# Patient Record
Sex: Male | Born: 1940 | Race: White | Hispanic: No | Smoking: Former smoker
Health system: Southern US, Community
[De-identification: ages and names within clinical notes are randomized; demographics above are authoritative.]

## PROBLEM LIST (undated history)

## (undated) DIAGNOSIS — I10 Essential (primary) hypertension: Secondary | ICD-10-CM

## (undated) DIAGNOSIS — N200 Calculus of kidney: Secondary | ICD-10-CM

## (undated) DIAGNOSIS — E7800 Pure hypercholesterolemia, unspecified: Secondary | ICD-10-CM

## (undated) DIAGNOSIS — E119 Type 2 diabetes mellitus without complications: Secondary | ICD-10-CM

## (undated) HISTORY — DX: Essential (primary) hypertension: I10

## (undated) HISTORY — DX: Type 2 diabetes mellitus without complications: E11.9

## (undated) HISTORY — DX: Calculus of kidney: N20.0

## (undated) HISTORY — PX: EYE SURGERY: SHX253

## (undated) HISTORY — DX: Pure hypercholesterolemia, unspecified: E78.00

## (undated) HISTORY — PX: TONSILLECTOMY: SUR1361

---

## 1999-01-09 ENCOUNTER — Ambulatory Visit (HOSPITAL_COMMUNITY): Admission: RE | Admit: 1999-01-09 | Discharge: 1999-01-10 | Payer: Self-pay | Admitting: Ophthalmology

## 1999-01-09 ENCOUNTER — Encounter: Payer: Self-pay | Admitting: Ophthalmology

## 2000-09-02 ENCOUNTER — Ambulatory Visit (HOSPITAL_COMMUNITY): Admission: RE | Admit: 2000-09-02 | Discharge: 2000-09-02 | Payer: Self-pay | Admitting: Internal Medicine

## 2000-09-02 ENCOUNTER — Encounter: Payer: Self-pay | Admitting: Internal Medicine

## 2001-03-31 ENCOUNTER — Ambulatory Visit (HOSPITAL_COMMUNITY): Admission: RE | Admit: 2001-03-31 | Discharge: 2001-04-01 | Payer: Self-pay | Admitting: Cardiovascular Disease

## 2001-03-31 ENCOUNTER — Encounter: Payer: Self-pay | Admitting: Cardiovascular Disease

## 2002-07-06 ENCOUNTER — Encounter: Admission: RE | Admit: 2002-07-06 | Discharge: 2002-10-04 | Payer: Self-pay | Admitting: Internal Medicine

## 2002-09-19 ENCOUNTER — Ambulatory Visit (HOSPITAL_COMMUNITY): Admission: RE | Admit: 2002-09-19 | Discharge: 2002-09-19 | Payer: Self-pay | Admitting: Internal Medicine

## 2003-05-04 ENCOUNTER — Emergency Department (HOSPITAL_COMMUNITY): Admission: EM | Admit: 2003-05-04 | Discharge: 2003-05-04 | Payer: Self-pay | Admitting: Emergency Medicine

## 2003-07-09 ENCOUNTER — Ambulatory Visit (HOSPITAL_COMMUNITY): Admission: RE | Admit: 2003-07-09 | Discharge: 2003-07-09 | Payer: Self-pay | Admitting: Internal Medicine

## 2004-09-25 ENCOUNTER — Ambulatory Visit (HOSPITAL_COMMUNITY): Admission: RE | Admit: 2004-09-25 | Discharge: 2004-09-25 | Payer: Self-pay | Admitting: Internal Medicine

## 2004-09-30 ENCOUNTER — Ambulatory Visit (HOSPITAL_COMMUNITY): Admission: RE | Admit: 2004-09-30 | Discharge: 2004-09-30 | Payer: Self-pay | Admitting: Internal Medicine

## 2005-07-07 ENCOUNTER — Ambulatory Visit: Payer: Self-pay | Admitting: *Deleted

## 2005-07-30 ENCOUNTER — Ambulatory Visit (HOSPITAL_COMMUNITY): Admission: RE | Admit: 2005-07-30 | Discharge: 2005-07-30 | Payer: Self-pay | Admitting: Cardiovascular Disease

## 2005-08-06 ENCOUNTER — Ambulatory Visit: Payer: Self-pay | Admitting: *Deleted

## 2006-11-26 ENCOUNTER — Ambulatory Visit (HOSPITAL_COMMUNITY): Admission: RE | Admit: 2006-11-26 | Discharge: 2006-11-26 | Payer: Self-pay | Admitting: Internal Medicine

## 2007-07-24 ENCOUNTER — Emergency Department (HOSPITAL_COMMUNITY): Admission: EM | Admit: 2007-07-24 | Discharge: 2007-07-24 | Payer: Self-pay | Admitting: Emergency Medicine

## 2007-08-04 ENCOUNTER — Ambulatory Visit (HOSPITAL_COMMUNITY): Admission: RE | Admit: 2007-08-04 | Discharge: 2007-08-04 | Payer: Self-pay | Admitting: Internal Medicine

## 2008-07-10 ENCOUNTER — Ambulatory Visit: Payer: Self-pay | Admitting: Internal Medicine

## 2008-07-30 ENCOUNTER — Ambulatory Visit: Payer: Self-pay | Admitting: Internal Medicine

## 2008-07-30 ENCOUNTER — Ambulatory Visit (HOSPITAL_COMMUNITY): Admission: RE | Admit: 2008-07-30 | Discharge: 2008-07-30 | Payer: Self-pay | Admitting: Internal Medicine

## 2008-07-30 ENCOUNTER — Encounter: Payer: Self-pay | Admitting: Internal Medicine

## 2008-08-01 ENCOUNTER — Encounter: Payer: Self-pay | Admitting: Internal Medicine

## 2008-11-02 ENCOUNTER — Encounter (INDEPENDENT_AMBULATORY_CARE_PROVIDER_SITE_OTHER): Payer: Self-pay | Admitting: *Deleted

## 2009-04-24 ENCOUNTER — Encounter: Payer: Self-pay | Admitting: Internal Medicine

## 2010-09-30 NOTE — Op Note (Signed)
NAME:  Calvin Mills, Calvin Mills                  ACCOUNT NO.:  [id number]   MEDICAL RECORD NO.:  [id number]          PATIENT TYPE:  AMB   LOCATION:  DAY                           FACILITY:  [facility]   PHYSICIAN:  R. Roetta Sessions, M.D. DATE OF BIRTH:  [birth year]   DATE OF PROCEDURE:  07/30/2008  DATE OF DISCHARGE:                               OPERATIVE REPORT   PROCEDURE PERFORMED:  Colonoscopy with multiple snare polypectomies.   INDICATIONS FOR PROCEDURE:  A 70 year old gentleman history of multiple  colonic adenomas, high-grade dysplasia back in 2005.  He has no lower GI  tract symptoms.  Colonoscopy is now being done as surveillance maneuver.  Risks, benefits, alternatives, and limitations have been reviewed.  Please see the dictation in the medical record.   PROCEDURE NOTE:  O2 saturation, blood pressure, pulse and respirations  were monitored throughout the entire procedure.  Conscious sedation with  Versed 3 mg IV, Demerol 50 mg IV in divided doses.   INSTRUMENT:  Pentax video chip system.   FINDINGS:  Digital rectal exam revealed no abnormalities.  The prep was  good.  Colon:  Colonic mucosa was surveyed from rectosigmoid junction through  the left, transverse and right colon to the appendiceal orifice,  ileocecal valve and cecum.  These structures were well seen and  photographed for record.  From this level, the scope was slowly and  cautiously withdrawn.  All previously mentioned mucosal surfaces were  again seen. Going in at the hepatic flexure there were couple of 6 mm  polyps which were hot snared and recovered.  There were two more 6-mm  polyps in the base the cecum which were cold snared and on the distal  side of the ileocecal valve there was a sprawling carpet polyp with  dimensions approximately 2-3 cm dipped in between the ileocecal valve  and the first fold distal to it.  It was difficult to fully appreciate  this area.  It appeared to be a sprawling carpet adenoma and just  opposite to this area there was a 1-cm pedunculated polyp.  This carpet  polyp was approached with normal saline.  A good 2-3 mL of normal saline  was injected at the base of this polyp and nicely lifted away from the  colonic wall.  Subsequently, multiple hot snare passes were made and  this thing was debulked significantly.  I would conservatively estimate  at least two-thirds this polyp was removed.  There was really minimal  bleeding with this maneuver.  The polyp on the opposite wall was  resected in two passes with the snare cautery and it was submitted with  the carpet polyp specimen.  These maneuvers took an extended period of  time.  The patient was noted to have also pan colonic diverticula and  may have had a small diminutive polyp in the mid descending colon which  was left alone.  Scope was pulled down to the rectum where thorough  examination of the rectal mucosa including retroflexion at the anal  verge demonstrated no abnormalities.  The patient tolerated the  procedure well,  was reacted in endoscopy.   IMPRESSION:  1. Normal rectum.  2. Pan colonic diverticula.  Multiple colonic polyps as described      above, the largest being 2 to 3 cm carpet polyp on the distal side      of the ileocecal valve and partially removed with saline assisted      piecemeal hot snare polypectomy.   RECOMMENDATIONS:  1. Absolutely no aspirin or nonsteroidal agents for the next 7 days.  2. Follow-up on path.  3. If there is high-grade pathology in the ascending colon specimen, I      would recommend he have a right hemicolectomy. If not, I would with      things heal in and plan to bring this nice gentleman back in 3      months to remove whenever adenomatous tissue remains in the      ascending colon.  Further recommendations to follow.  Cecal      withdrawal time 48 minutes.      Jonathon Bellows, M.D.  Electronically Signed     RMR/MEDQ  D:  07/30/2008  T:  07/30/2008  Job:   [id number]

## 2010-09-30 NOTE — H&P (Signed)
NAME:  Calvin Mills, Calvin Mills                  ACCOUNT NO.:  [id number]   MEDICAL RECORD NO.:  [id number]           PATIENT TYPE:  AMB   LOCATION:  DAY                           FACILITY:  [facility]   PHYSICIAN:  R. Roetta Sessions, M.D. DATE OF BIRTH:  November 26, 1940   DATE OF ADMISSION:  DATE OF DISCHARGE:  LH                              HISTORY & PHYSICAL   CHIEF COMPLAINT:  Needs colonoscopy, history of polyps.   PHYSICIAN CO-SIGNING:  Jonathon Bellows, MD   HISTORY OF PRESENT ILLNESS:  Calvin Mills is a very pleasant 70 year old  gentleman who was due for a 5-year followup surveillance colonoscopy.  He has a history of complicated adenomatous polyps in the past.  He had  a colonoscopy in 2004 at which time he had a large sessile polyp from  the proximal rectum that was greater than 15 mm.  He has small polyp at  the ascending colon in the splenic flexure as well.  Pathology from the  rectal polyp came back adenomatous and had intramucosal high grade  dysplasia, but no invasive tumor.  The patient came back for followup  flexible sigmoidoscopy in February 2005 and had 2 small flap polyps  noted to be residual at the polypectomy site in the rectosigmoid area  which were coagulated.  He also had a tiny polyp in the sigmoid region  which was benign.  He has been doing well.  Denies any problems with his  bowel movements.  No blood in the stool or melena.  No abdominal pain,  heartburn, dysphagia, odynophagia, weight loss, nausea, or vomiting.   CURRENT MEDICATIONS:  1. Glipizide 5 mg daily.  2. Toprol 50 mg daily.  3. Multivitamin daily.  4. Aspirin 81 mg daily.   ALLERGIES:  PENICILLIN causes rash.   PAST MEDICAL HISTORY:  1. History of colon polyps, as above.  2. Diabetes mellitus.  3. CAD, status post cardiac stent 8 years ago.   FAMILY HISTORY:  Brother was diagnosed with colon cancer, age 71.   SOCIAL HISTORY:  Married with 2 children.  He is retired.  He quit  smoking 15 years ago.  No  alcohol use.   REVIEW OF SYSTEMS:  GI:  See HPI.  CONSTITUTIONAL.  No weight loss.  CARDIOPULMONARY:  No chest pain, shortness of breath, palpitations, or  cough.  GENITOURINARY:  No dysuria, hematuria.   PHYSICAL EXAMINATION:  VITAL SIGNS:  Weight 195, height 6 feet,  temperature 97.4, blood pressure 152/98, pulse 76.  GENERAL:  Pleasant, well-nourished, well-developed, Caucasian gentleman  in no acute distress.  SKIN:  Warm, dry.  No jaundice.  HEENT: Sclerae nonicteric.  Oropharyngeal mucosa moist and pink.  No  lesions, erythema, or exudate.  No lymphadenopathy, thyromegaly.  CHEST:  Lungs are clear to auscultation.  CARDIAC:  Regular rate and rhythm.  No murmurs, rubs, or gallops.  ABDOMEN:  Positive bowel sounds.  Abdomen is soft, nontender,  nondistended.  No organomegaly or masses.  No rebound or guarding.  No  abdominal bruits or hernias.  LOWER EXTREMITIES:  No edema.  IMPRESSION:  Calvin Mills is a 70 year old gentleman with history of  complicated adenomatous polyp with high grade dysplasia back in 2005.  He is due for surveillance colonoscopy at this time.  I discussed risks,  alternatives, benefits with regards to, but not limited to the risk,  reaction of medication, bleeding, infection, perforation, and he is  agreeable to proceed.   PLAN:  1. Colonoscopy with Dr. Jena Gauss in the near future.  The day of the      prep, he will have his glipizide 2.5 mg.  He will hold his aspirin      4 days prior to procedure.  2. The patient is asked that he have minimal sedation, so that he      could be more awake during the colonoscopy.  Previously, he has      tolerated this.  Tana Coast, P.A.      Jonathon Bellows, M.D.  Electronically Signed    LL/MEDQ  D:  07/10/2008  T:  07/11/2008  Job:  [id number]   cc:   Madelin Rear. Sherwood Gambler, MD  Fax: [phone]

## 2010-10-03 NOTE — Cardiovascular Report (Signed)
[place]  Patient:    Calvin Mills, Calvin Mills Visit Number: [id number] MRN: [id number]          Service Type: CAT Location: 3700 [id number] [id number] Attending Physician:  Virgina Evener Dictated by:   Madaline Savage, M.D. Proc. Date: 03/31/01 Admit Date:  03/31/2001   CC:         Artis Delay, M.D., [facility], [place], [place]  Cardiac Catheterization Laboratory   Cardiac Catheterization  PROCEDURES PERFORMED: 1. Selective coronary angiography by Judkins technique. 2. Retrograde left heart catheterization. 3. Left ventricular angiography. 4. Intracoronary artery stent deployment into the mid right coronary artery.  COMPLICATIONS: None.  ENTRY SITE: Right femoral.  DYE USED: Omnipaque.  PATIENT PROFILE: The patient is a 70 year old previous smoker who has treated hypertension. He recently had some indigestion type symptoms and was referred for treadmill testing and was found to have inferior and anterolateral ST segment depressions with exercise.  Today, the patient presents to the catheterization lab on an outpatient basis electively to correlate symptoms and abnormal ECG with coronary anatomy.  RESULTS:  PRESSURES: The left ventricular pressure was 125/15, central aortic pressure was 120/73.  No significant aortic valve gradient was noted by pullback technique.  ANGIOGRAPHIC RESULTS: The left main coronary artery was normal.  The left anterior descending coronary artery coursed to the cardiac apex and gave rise to tiny diagonal branches, approximately three of them and one major septal perforator branch. The LAD wrapped around the apex.  The left circumflex coronary artery was nondominant and no lesions were seen.  The right coronary artery was large and dominant giving rise to a posterolateral and a posterior descending branch of moderate size. There was a lesion in the mid right coronary artery between acute marginal  branch #1 and acute marginal branch #2 that was a type A lesion, 75% severe in the 33 degree RAO view with 15 degrees of caudal angulation.  The left ventricle showed good contractility with normal wall motion and an ejection fraction of 77% calculated. No mitral regurgitation was seen.  INTERVENTIONAL PROCEDURE: This procedure was performed with 7 French introducer sheaths and 7 French catheters. The guide catheter was a 7 [place] 4 right guide catheter with side holes. The guide wire was a short Patriot wire and direct coronary artery stenting was accomplished using a 3.0 x 12 mm Express II stent by [employer]. The guide catheter provided excellent backup support and good visualization of the vessel. The guide wire easily coursed though the lesion and was brought to rest in the distal posterolateral branch. The stent easily crossed the lesion and was deployed to a total of 16 atmospheres for 60 seconds, and we took views in both LAO and RAO projections and saw no residual stenosis. The initial TIMI-3 class flow was preserved at the end of stenting. There was mild ST segment elevation in the inferior leads during balloon inflation of the stent and there was clinically the same discomfort during balloon inflation that the patient has experienced in the recent past.  No complications occurred. Direct stenting was successful by reducing the 75% lesion to 0% residual.  FINAL DIAGNOSES: 1. Successful intracoronary artery stenting of the mid right coronary artery    with a right coronary artery lesion in the mid segment reduced from    75% to 0%. 2. Normal left anterior descending, circumflex and left main coronary    arteries. 3. Normal left ventricular function with ejection fraction of 77%.  PLAN: Risk factor modification, optimization of blood pressure medicines and clinical followup. Dictated by:   Madaline Savage, M.D. Attending Physician:  Virgina Evener DD:   03/31/01 TD:  03/31/01 Job: [id number]

## 2010-10-03 NOTE — Discharge Summary (Signed)
[place]. [facility]  Patient:    Calvin Mills, Calvin Mills Visit Number: [id number] MRN: [id number]          Service Type: CAT Location: 3700 3713 01 Attending Physician:  Virgina Evener Dictated by:   Raymon Mutton, P.A. Admit Date:  03/31/2001 Discharge Date: 04/01/2001                             Discharge Summary  DATE OF BIRTH:  04/08/1941.  DISCHARGE DIAGNOSES: 1. Coronary artery disease status post cardiac catheterization with coronary    intervention. 2. Hypertension. 3. Unknown lipid status, will follow up on that as an outpatient.  HOSPITAL PROCEDURE:  Cardiac catheterization performed on April 01, 2001, by Madaline Savage, M.D.  Cath showed 75% mid RCA stenosis and he underwent PTCA and stent to mid RCA with a reduction of stenosis from 75% to 0, ejection fraction 60%.  HOSPITAL COMPLICATIONS:  None.  HISTORY OF PRESENT ILLNESS:  A 70 year old gentleman who has been seen in [place], [place], last time with complaints of indigestion and was scheduled for stress test but the initial stress test was cancelled secondary to increased blood pressure and he was scheduled for March 10, 2001 and during the treadmill, his blood pressure increased and stress test revealed abnormal myocardial perfusion and he was scheduled for cardiac catheterization with possible intervention as elective outpatient.  HOSPITAL COURSE:  He underwent the procedure as outlined above and tolerated it well.  He was given bolus drip of Integrelin, initial TIMI-3 plus flow was preserved at the end of stenting and the patient was stable throughout his admission.  His physical examination was benign.  Blood pressure 122/72, pulse 65. He did not develop any complications such as bleeding or ecchymosis from the groin site.  He was discharged home the next day with instructions to avoid heavy lifting and driving and strenuous activity for three days. Continue  low fat, low cholesterol diet and report any problems to our office that he could experience after a cath.  DISCHARGE MEDICATIONS: 1. Enteric coated aspirin 325 mg daily. 2. Plavix 75 mg daily. 3. Protonix 40 mg daily. 4. Toprol XL 50 mg daily.  FOLLOW-UP:  Appointment was scheduled with Dr. Elsie Lincoln on April 18, 2001, at 4 p.m. in [place], [place], office. Dictated by:   Raymon Mutton, P.A. Attending Physician:  Virgina Evener DD:  04/01/01 TD:  04/01/01 Job: [id number]

## 2010-10-03 NOTE — Op Note (Signed)
NAME:  [name], Calvin Mills                            ACCOUNT NO.:  [id number]   MEDICAL RECORD NO.:  [id number]                   PATIENT TYPE:  AMB   LOCATION:  DAY                                  FACILITY:  [facility]   PHYSICIAN:  Lionel December, M.D.                 DATE OF BIRTH:  1940/12/29   DATE OF PROCEDURE:  07/09/2003  DATE OF DISCHARGE:                                 OPERATIVE REPORT   PROCEDURE:  Flexible sigmoidoscopy.   ENDOSCOPIST:  Lionel December, M.D.   INDICATIONS:  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  He was advised to undergo a sigmoidoscopy to make sure that there was no  residual polyp at polypectomy site.  He is asymptomatic.  The procedure and  risks were reviewed with the patient and informed consent was obtained.   PREOPERATIVE MEDICATIONS:  None.   FINDINGS:  Procedure performed in endoscopy suite. The patient was placed in  the left lateral recumbent position and rectal examination performed.  No  abnormality noted on external or digital exam.   Olympus videoscope was placed in the rectum and advanced under vision into  the sigmoid colon.  There was 1 small diverticulum at the sigmoid colon.  There was a 3 mm polyp at about 30 cm which was ablated by cold biopsy.  The  rectosigmoid area was carefully examined. There were 2 small flat polyps  which were noted to be residual at polypectomy site. One on the left side  was coagulated using snare tip.  The other one was raised with a submucosal  injection of saline, but never could be snared, but was completely  coagulated.  Pictures taken for the record.  There were 3 small rectal  polyps that were coagulated.   Endoscope was withdrawn.  The patient tolerated the procedure well.   FINAL DIAGNOSES:  1. Tiny residual polyp (2 pieces) at  rectosigmoid junction.  They were     coagulated using snare tip as described above.  2. Another tiny polyp ablated by a cold biopsy from the sigmoid colon; and     the 3 tiny polyps at rectum were coagulated.   RECOMMENDATIONS:  1. Standard instructions were given.  2. I will be contacting the patient with biopsy results.  3. High fiber diet.  4. He will return for surveillance colonoscopy in 5 years from now.      ___________________________________________                                            Lionel December, M.D.   NR/MEDQ  D:  07/09/2003  T:  07/09/2003  Job:  [id number]   cc:   Madelin Rear. Sherwood Gambler, M.D.  [address]  [place]  [place] [address]  Fax: [phone]

## 2010-10-03 NOTE — Op Note (Signed)
NAME:  Calvin Mills, Calvin Mills                            ACCOUNT NO.:  [id number]   MEDICAL RECORD NO.:  [id number]                   PATIENT TYPE:  AMB   LOCATION:  DAY                                  FACILITY:  [facility]   PHYSICIAN:  Lionel December, M.D.                 DATE OF BIRTH:  [birth year]   DATE OF PROCEDURE:  09/19/2002  DATE OF DISCHARGE:                                 OPERATIVE REPORT   PROCEDURE:  Total colonoscopy with polypectomy.   INDICATIONS FOR PROCEDURE:  The patient is a 70 year old Caucasian male who  is undergoing screening colonoscopy.  Family history is negative for  colorectal carcinoma.  The procedure was reviewed with the patient, and  informed consent was obtained.   PREOPERATIVE MEDICATIONS:  Versed 3 mg IV in divided doses.   INSTRUMENT USED:  Olympus video system.   FINDINGS:  The procedure was performed in the endoscopy suite.  The  patient's vital signs and O2 saturations were monitored during the procedure  and remained stable.  The patient was placed in the left lateral position  and rectal examination performed.  No abnormality was noted on external or  digital exam.  The scope was placed into the rectum where a large flat polyp  was noted.  It was villous-appearing.  It was felt on the way out.  The  scope was passed to the sigmoid colon and beyond.  The preparation was  satisfactory.  The scope was passed to the cecum which was identified by the  appendiceal orifice and ileocecal valve.  As the scope was withdrawn, the  colonic mucosa was carefully examined.  There was a small polyp on a fold at  the ascending colon which was coagulated using the snare tip.  There was  another small polyp in the area of the splenic flexure which was seen on the  way in but could not be found on the way out.  A few small diverticula were  noted in the sigmoid colon.  There was a flat villous-appearing polyp,  greater than 15 mm in diameter, at the proximal rectum.  This  was snared  piecemeal.  Polypectomy was felt to be complete.  The mucosa of the rest of  the rectum was normal.  The endoscope was withdrawn.  The patient tolerated  the procedure well.   FINAL DIAGNOSES:  1. Examination performed to the cecum.  2. Large sessile polyp snared from proximal rectum.  3. Another small polyp coagulated at ascending colon.  Another small polyp     was seen at the splenic flexure on the way in but could not be found on     the way out.  4. Mild sigmoid colon diverticulosis.   RECOMMENDATIONS:  1. Standard instructions given.  2.     High fiber diet.  3. I will contact the patient with the biopsy results.  Assuming that this     is an adenoma, will plan to bring him back for another colonoscopy in     three years from now.                                               Lionel December, M.D.    NR/MEDQ  D:  09/19/2002  T:  09/19/2002  Job:  [id number]   cc:   Madelin Rear. Sherwood Gambler, M.D.  [address]  [place]  [place] [address]  Fax: [phone]

## 2011-09-10 DIAGNOSIS — Z6825 Body mass index (BMI) 25.0-25.9, adult: Secondary | ICD-10-CM | POA: Diagnosis not present

## 2011-09-10 DIAGNOSIS — I10 Essential (primary) hypertension: Secondary | ICD-10-CM | POA: Diagnosis not present

## 2011-09-10 DIAGNOSIS — E785 Hyperlipidemia, unspecified: Secondary | ICD-10-CM | POA: Diagnosis not present

## 2011-09-10 DIAGNOSIS — Z125 Encounter for screening for malignant neoplasm of prostate: Secondary | ICD-10-CM | POA: Diagnosis not present

## 2011-09-10 DIAGNOSIS — R079 Chest pain, unspecified: Secondary | ICD-10-CM | POA: Diagnosis not present

## 2011-09-10 DIAGNOSIS — E119 Type 2 diabetes mellitus without complications: Secondary | ICD-10-CM | POA: Diagnosis not present

## 2011-09-10 DIAGNOSIS — I2510 Atherosclerotic heart disease of native coronary artery without angina pectoris: Secondary | ICD-10-CM | POA: Diagnosis not present

## 2011-09-10 DIAGNOSIS — N200 Calculus of kidney: Secondary | ICD-10-CM | POA: Diagnosis not present

## 2011-09-10 DIAGNOSIS — Z0000 Encounter for general adult medical examination without abnormal findings: Secondary | ICD-10-CM | POA: Diagnosis not present

## 2011-12-18 DIAGNOSIS — H524 Presbyopia: Secondary | ICD-10-CM | POA: Diagnosis not present

## 2011-12-18 DIAGNOSIS — H5231 Anisometropia: Secondary | ICD-10-CM | POA: Diagnosis not present

## 2011-12-18 DIAGNOSIS — H2589 Other age-related cataract: Secondary | ICD-10-CM | POA: Diagnosis not present

## 2011-12-18 DIAGNOSIS — E11329 Type 2 diabetes mellitus with mild nonproliferative diabetic retinopathy without macular edema: Secondary | ICD-10-CM | POA: Diagnosis not present

## 2011-12-22 DIAGNOSIS — E119 Type 2 diabetes mellitus without complications: Secondary | ICD-10-CM | POA: Diagnosis not present

## 2011-12-22 DIAGNOSIS — H2510 Age-related nuclear cataract, unspecified eye: Secondary | ICD-10-CM | POA: Diagnosis not present

## 2012-06-16 DIAGNOSIS — M549 Dorsalgia, unspecified: Secondary | ICD-10-CM | POA: Diagnosis not present

## 2012-06-16 DIAGNOSIS — IMO0001 Reserved for inherently not codable concepts without codable children: Secondary | ICD-10-CM | POA: Diagnosis not present

## 2012-06-16 DIAGNOSIS — Z6826 Body mass index (BMI) 26.0-26.9, adult: Secondary | ICD-10-CM | POA: Diagnosis not present

## 2012-09-20 DIAGNOSIS — Z125 Encounter for screening for malignant neoplasm of prostate: Secondary | ICD-10-CM | POA: Diagnosis not present

## 2012-09-20 DIAGNOSIS — Z6825 Body mass index (BMI) 25.0-25.9, adult: Secondary | ICD-10-CM | POA: Diagnosis not present

## 2012-09-20 DIAGNOSIS — Z0000 Encounter for general adult medical examination without abnormal findings: Secondary | ICD-10-CM | POA: Diagnosis not present

## 2012-09-20 DIAGNOSIS — E11329 Type 2 diabetes mellitus with mild nonproliferative diabetic retinopathy without macular edema: Secondary | ICD-10-CM | POA: Diagnosis not present

## 2012-09-20 DIAGNOSIS — I2510 Atherosclerotic heart disease of native coronary artery without angina pectoris: Secondary | ICD-10-CM | POA: Diagnosis not present

## 2012-09-20 DIAGNOSIS — E785 Hyperlipidemia, unspecified: Secondary | ICD-10-CM | POA: Diagnosis not present

## 2012-09-20 DIAGNOSIS — E1139 Type 2 diabetes mellitus with other diabetic ophthalmic complication: Secondary | ICD-10-CM | POA: Diagnosis not present

## 2012-09-20 DIAGNOSIS — Z79899 Other long term (current) drug therapy: Secondary | ICD-10-CM | POA: Diagnosis not present

## 2012-10-13 ENCOUNTER — Ambulatory Visit (INDEPENDENT_AMBULATORY_CARE_PROVIDER_SITE_OTHER): Payer: Medicare Other | Admitting: Otolaryngology

## 2012-10-13 DIAGNOSIS — H6120 Impacted cerumen, unspecified ear: Secondary | ICD-10-CM

## 2012-10-13 DIAGNOSIS — H903 Sensorineural hearing loss, bilateral: Secondary | ICD-10-CM | POA: Diagnosis not present

## 2013-03-22 DIAGNOSIS — Z23 Encounter for immunization: Secondary | ICD-10-CM | POA: Diagnosis not present

## 2013-07-24 DIAGNOSIS — E119 Type 2 diabetes mellitus without complications: Secondary | ICD-10-CM | POA: Diagnosis not present

## 2013-07-24 DIAGNOSIS — I2510 Atherosclerotic heart disease of native coronary artery without angina pectoris: Secondary | ICD-10-CM | POA: Diagnosis not present

## 2013-07-24 DIAGNOSIS — I10 Essential (primary) hypertension: Secondary | ICD-10-CM | POA: Diagnosis not present

## 2013-09-29 DIAGNOSIS — H52229 Regular astigmatism, unspecified eye: Secondary | ICD-10-CM | POA: Diagnosis not present

## 2013-09-29 DIAGNOSIS — H5231 Anisometropia: Secondary | ICD-10-CM | POA: Diagnosis not present

## 2013-09-29 DIAGNOSIS — H33059 Total retinal detachment, unspecified eye: Secondary | ICD-10-CM | POA: Diagnosis not present

## 2013-09-29 DIAGNOSIS — H524 Presbyopia: Secondary | ICD-10-CM | POA: Diagnosis not present

## 2013-10-12 ENCOUNTER — Ambulatory Visit (INDEPENDENT_AMBULATORY_CARE_PROVIDER_SITE_OTHER): Payer: Medicare Other | Admitting: Otolaryngology

## 2013-10-19 DIAGNOSIS — I10 Essential (primary) hypertension: Secondary | ICD-10-CM | POA: Diagnosis not present

## 2013-10-19 DIAGNOSIS — M5030 Other cervical disc degeneration, unspecified cervical region: Secondary | ICD-10-CM | POA: Diagnosis not present

## 2013-10-19 DIAGNOSIS — Z6826 Body mass index (BMI) 26.0-26.9, adult: Secondary | ICD-10-CM | POA: Diagnosis not present

## 2014-02-22 DIAGNOSIS — Z0000 Encounter for general adult medical examination without abnormal findings: Secondary | ICD-10-CM | POA: Diagnosis not present

## 2014-02-22 DIAGNOSIS — E782 Mixed hyperlipidemia: Secondary | ICD-10-CM | POA: Diagnosis not present

## 2014-02-22 DIAGNOSIS — Z23 Encounter for immunization: Secondary | ICD-10-CM | POA: Diagnosis not present

## 2014-02-22 DIAGNOSIS — Z86010 Personal history of colonic polyps: Secondary | ICD-10-CM | POA: Diagnosis not present

## 2014-02-22 DIAGNOSIS — E663 Overweight: Secondary | ICD-10-CM | POA: Diagnosis not present

## 2014-02-22 DIAGNOSIS — Z6825 Body mass index (BMI) 25.0-25.9, adult: Secondary | ICD-10-CM | POA: Diagnosis not present

## 2014-02-22 DIAGNOSIS — E119 Type 2 diabetes mellitus without complications: Secondary | ICD-10-CM | POA: Diagnosis not present

## 2014-02-22 DIAGNOSIS — I10 Essential (primary) hypertension: Secondary | ICD-10-CM | POA: Diagnosis not present

## 2014-02-23 DIAGNOSIS — Z0000 Encounter for general adult medical examination without abnormal findings: Secondary | ICD-10-CM | POA: Diagnosis not present

## 2014-02-23 DIAGNOSIS — Z86010 Personal history of colonic polyps: Secondary | ICD-10-CM | POA: Diagnosis not present

## 2014-02-23 DIAGNOSIS — E119 Type 2 diabetes mellitus without complications: Secondary | ICD-10-CM | POA: Diagnosis not present

## 2014-02-23 DIAGNOSIS — E663 Overweight: Secondary | ICD-10-CM | POA: Diagnosis not present

## 2014-02-23 DIAGNOSIS — I10 Essential (primary) hypertension: Secondary | ICD-10-CM | POA: Diagnosis not present

## 2014-02-23 DIAGNOSIS — Z23 Encounter for immunization: Secondary | ICD-10-CM | POA: Diagnosis not present

## 2014-02-23 DIAGNOSIS — E782 Mixed hyperlipidemia: Secondary | ICD-10-CM | POA: Diagnosis not present

## 2014-02-23 DIAGNOSIS — Z6825 Body mass index (BMI) 25.0-25.9, adult: Secondary | ICD-10-CM | POA: Diagnosis not present

## 2014-03-05 ENCOUNTER — Telehealth: Payer: Self-pay

## 2014-03-05 NOTE — Telephone Encounter (Signed)
Pt called and he does not want to have his colonoscopy here. He wants to see Dr. Laural Golden. I told him to have Dr. Gerarda Fraction send the referral to Dr. Laural Golden then. I sent letter to Dr. Gerarda Fraction.

## 2014-03-05 NOTE — Telephone Encounter (Signed)
Received referral from Dr. Gerarda Fraction for colonoscopy. Pt had colonoscopy in 07/30/2008 by Dr. Gala Romney. Needs OV.  LMOM to call.

## 2014-03-13 ENCOUNTER — Encounter (INDEPENDENT_AMBULATORY_CARE_PROVIDER_SITE_OTHER): Payer: Self-pay | Admitting: *Deleted

## 2014-04-03 ENCOUNTER — Ambulatory Visit (INDEPENDENT_AMBULATORY_CARE_PROVIDER_SITE_OTHER): Payer: Medicare Other | Admitting: Urology

## 2014-04-03 DIAGNOSIS — N529 Male erectile dysfunction, unspecified: Secondary | ICD-10-CM | POA: Diagnosis not present

## 2014-04-03 DIAGNOSIS — R972 Elevated prostate specific antigen [PSA]: Secondary | ICD-10-CM | POA: Diagnosis not present

## 2014-04-04 ENCOUNTER — Encounter (INDEPENDENT_AMBULATORY_CARE_PROVIDER_SITE_OTHER): Payer: Self-pay | Admitting: Internal Medicine

## 2014-04-04 ENCOUNTER — Ambulatory Visit (INDEPENDENT_AMBULATORY_CARE_PROVIDER_SITE_OTHER): Payer: Medicare Other | Admitting: Internal Medicine

## 2014-04-04 ENCOUNTER — Telehealth (INDEPENDENT_AMBULATORY_CARE_PROVIDER_SITE_OTHER): Payer: Self-pay | Admitting: *Deleted

## 2014-04-04 ENCOUNTER — Other Ambulatory Visit (INDEPENDENT_AMBULATORY_CARE_PROVIDER_SITE_OTHER): Payer: Self-pay | Admitting: *Deleted

## 2014-04-04 VITALS — BP 112/52 | HR 64 | Temp 97.7°F | Ht 72.0 in | Wt 186.6 lb

## 2014-04-04 DIAGNOSIS — I10 Essential (primary) hypertension: Secondary | ICD-10-CM | POA: Diagnosis not present

## 2014-04-04 DIAGNOSIS — Z1211 Encounter for screening for malignant neoplasm of colon: Secondary | ICD-10-CM

## 2014-04-04 DIAGNOSIS — Z79899 Other long term (current) drug therapy: Secondary | ICD-10-CM

## 2014-04-04 DIAGNOSIS — E785 Hyperlipidemia, unspecified: Secondary | ICD-10-CM | POA: Diagnosis not present

## 2014-04-04 DIAGNOSIS — Z86010 Personal history of colonic polyps: Secondary | ICD-10-CM

## 2014-04-04 DIAGNOSIS — E119 Type 2 diabetes mellitus without complications: Secondary | ICD-10-CM | POA: Insufficient documentation

## 2014-04-04 MED ORDER — PEG 3350-KCL-NA BICARB-NACL 420 G PO SOLR: 4000.0000 mL | Freq: Once | ORAL | Status: DC

## 2014-04-04 NOTE — Telephone Encounter (Signed)
Patient needs trilyte 

## 2014-04-04 NOTE — Progress Notes (Signed)
   Subjective:    Patient ID: Calvin Mills, male    DOB: 09-Sep-1940, 73 y.o.   MRN: [id number]  HPI Here today for medication management/schedule surveillance colonoscopy. Last colonoscopy by Dr. Gala Romney. Hx colonic adeomas, high grade dysplasia back in 2005. PCP Dr. Gerarda Fraction.  Appetite good. No weight loss. No dysphagia. No abdominal pain. Has a BM daily. No melena or BRRB   02/22/2014  H and H 15.4 and 42.4, platelet ct 217. MCV 91.    07/25/2008 Colonoscopy Dr. Gala Romney:   PROCEDURE PERFORMED:  Colonoscopy with multiple snare polypectomies.    INDICATIONS FOR PROCEDURE:  A 73 year old gentleman history of multiple  colonic adenomas, high-grade dysplasia back in 2005.  He has no lower GI  tract symptoms.  Colonoscopy is now being done as surveillance maneuver.  Risks, benefits, alternatives, and limitations have been reviewed.  Please see the dictation in the medical record.  IMPRESSION:  1. Normal rectum.  2. Pan colonic diverticula.  Multiple colonic polyps as described      above, the largest being 2 to 3 cm carpet polyp on the distal side      of the ileocecal valve and partially removed with saline assisted      piecemeal hot snare polypectomy.  Review of Systems Married. Retired from [employer]. Two children in good health.      Objective:   Physical Exam  Filed Vitals:   04/04/14 0951  Height: 6' (1.829 m)  Weight: 186 lb 9.6 oz (84.641 kg)   Alert and oriented. Skin warm and dry. Oral mucosa is moist.   . Sclera anicteric, conjunctivae is pink. Thyroid not enlarged. No cervical lymphadenopathy. Lungs clear. Heart regular rate and rhythm.  Abdomen is soft. Bowel sounds are positive. No hepatomegaly. No abdominal masses felt. No tenderness.  No edema to lower extremities.          Assessment & Plan:  Colonic adenomas. Due for surveillance Surveillance colonoscopy

## 2014-04-04 NOTE — Patient Instructions (Signed)
Surveillance colonoscopy. The risks and benefits such as perforation, bleeding, and infection were reviewed with the patient and is agreeable.

## 2014-05-02 ENCOUNTER — Encounter (HOSPITAL_COMMUNITY): Payer: Self-pay

## 2014-05-02 ENCOUNTER — Encounter (INDEPENDENT_AMBULATORY_CARE_PROVIDER_SITE_OTHER): Payer: Self-pay

## 2014-05-02 ENCOUNTER — Encounter (HOSPITAL_COMMUNITY)
Admission: RE | Disposition: A | Discharge status: Home / Self Care | Payer: Self-pay | Source: Ambulatory Visit | Attending: Internal Medicine

## 2014-05-02 ENCOUNTER — Ambulatory Visit (HOSPITAL_COMMUNITY)
Admission: RE | Admit: 2014-05-02 | Discharge: 2014-05-02 | Disposition: A | Discharge status: Home / Self Care | Payer: Medicare Other | Source: Ambulatory Visit | Attending: Internal Medicine | Admitting: Internal Medicine

## 2014-05-02 DIAGNOSIS — I10 Essential (primary) hypertension: Secondary | ICD-10-CM | POA: Diagnosis not present

## 2014-05-02 DIAGNOSIS — Z7982 Long term (current) use of aspirin: Secondary | ICD-10-CM | POA: Diagnosis not present

## 2014-05-02 DIAGNOSIS — Z87891 Personal history of nicotine dependence: Secondary | ICD-10-CM | POA: Diagnosis not present

## 2014-05-02 DIAGNOSIS — D123 Benign neoplasm of transverse colon: Secondary | ICD-10-CM | POA: Insufficient documentation

## 2014-05-02 DIAGNOSIS — Z1211 Encounter for screening for malignant neoplasm of colon: Secondary | ICD-10-CM | POA: Insufficient documentation

## 2014-05-02 DIAGNOSIS — Z86010 Personal history of colonic polyps: Secondary | ICD-10-CM

## 2014-05-02 DIAGNOSIS — K5730 Diverticulosis of large intestine without perforation or abscess without bleeding: Secondary | ICD-10-CM | POA: Insufficient documentation

## 2014-05-02 DIAGNOSIS — D122 Benign neoplasm of ascending colon: Secondary | ICD-10-CM | POA: Diagnosis not present

## 2014-05-02 DIAGNOSIS — D126 Benign neoplasm of colon, unspecified: Secondary | ICD-10-CM | POA: Diagnosis not present

## 2014-05-02 DIAGNOSIS — K648 Other hemorrhoids: Secondary | ICD-10-CM | POA: Insufficient documentation

## 2014-05-02 DIAGNOSIS — E119 Type 2 diabetes mellitus without complications: Secondary | ICD-10-CM | POA: Insufficient documentation

## 2014-05-02 DIAGNOSIS — E780 Pure hypercholesterolemia: Secondary | ICD-10-CM | POA: Insufficient documentation

## 2014-05-02 HISTORY — PX: COLONOSCOPY: SHX5424

## 2014-05-02 LAB — GLUCOSE, CAPILLARY: GLUCOSE-CAPILLARY: 113 mg/dL — AB (ref 70–99)

## 2014-05-02 SURGERY — COLONOSCOPY
Anesthesia: Moderate Sedation

## 2014-05-02 MED ORDER — MEPERIDINE HCL 50 MG/ML IJ SOLN
INTRAMUSCULAR | Status: DC | PRN
  Administered 2014-05-02: 25 mg via INTRAVENOUS

## 2014-05-02 MED ORDER — MIDAZOLAM HCL 5 MG/5ML IJ SOLN
INTRAMUSCULAR | Status: DC | PRN
  Administered 2014-05-02 (×2): 2 mg via INTRAVENOUS
  Administered 2014-05-02: 1 mg via INTRAVENOUS

## 2014-05-02 MED ORDER — SODIUM CHLORIDE 0.9 % IJ SOLN
INTRAMUSCULAR | Status: AC
  Filled 2014-05-02: qty 30

## 2014-05-02 MED ORDER — MIDAZOLAM HCL 5 MG/5ML IJ SOLN
INTRAMUSCULAR | Status: AC
  Filled 2014-05-02: qty 10

## 2014-05-02 MED ORDER — SODIUM CHLORIDE 0.9 % IV SOLN
INTRAVENOUS | Status: DC
  Administered 2014-05-02: 09:00:00 via INTRAVENOUS

## 2014-05-02 MED ORDER — MEPERIDINE HCL 50 MG/ML IJ SOLN
INTRAMUSCULAR | Status: DC
Start: 2014-05-02 — End: 2014-05-02
  Filled 2014-05-02: qty 1

## 2014-05-02 NOTE — H&P (Signed)
Calvin Mills is an 73 y.o. male.   Chief Complaint: Patient is here for colonoscopy. HPI: Patient is 73 year old Caucasian male with history of colonic adenomas and is here for surveillance colonoscopy. First colonoscopy was in 2004 with removal of large rectal adenoma with dysplasia. Last colonoscopy( Dr. 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 Patient believes he had another exam since then but there is no record. He denies abdominal pain change in bowel habits or rectal bleeding. Family history is negative for CRC.  Past Medical History  Diagnosis Date  . Kidney stone   . Hypertension   . High cholesterol   . Diabetes     Past Surgical History  Procedure Laterality Date  . Eye surgery      15 yrs ago: torn retina    No family history on file. Social History:  reports that he has quit smoking. His smoking use included Cigarettes. He has a 15 pack-year smoking history. He does not have any smokeless tobacco history on file. He reports that he does not drink alcohol or use illicit drugs.  Allergies: No Known Allergies  Medications Prior to Admission  Medication Sig Dispense Refill  . aspirin 81 MG chewable tablet Chew by mouth daily.    Marland Kitchen glipiZIDE (GLUCOTROL) 10 MG tablet Take 10 mg by mouth 2 (two) times daily before a meal.    . lisinopril-hydrochlorothiazide (PRINZIDE,ZESTORETIC) 20-25 MG per tablet Take 1 tablet by mouth daily.    . metFORMIN (GLUCOPHAGE) 1000 MG tablet Take 1,000 mg by mouth.    . polyethylene glycol-electrolytes (TRILYTE) 420 G solution Take 4,000 mLs by mouth once. 4000 mL 0    Results for orders placed or performed during the hospital encounter of 05/02/14 (from the past 48 hour(s))  Glucose, capillary     Status: Abnormal   Collection Time: 05/02/14  8:28 AM  Result Value Ref Range   Glucose-Capillary 113 (H) 70 - 99 mg/dL   No results found.  ROS  Blood  pressure 131/73, pulse 72, temperature 97.7 F (36.5 C), temperature source Oral, resp. rate 12, height 6' (1.829 m), weight 180 lb (81.647 kg), SpO2 94 %. Physical Exam  Constitutional: He appears well-developed and well-nourished.  HENT:  Mouth/Throat: Oropharynx is clear and moist.  Eyes: Conjunctivae are normal. No scleral icterus.  Neck: No thyromegaly present.  Cardiovascular: Normal rate, regular rhythm and normal heart sounds.   No murmur heard. Respiratory: Effort normal and breath sounds normal.  GI: Soft. He exhibits no distension and no mass. There is no tenderness.  Musculoskeletal: He exhibits no edema.  Lymphadenopathy:    He has no cervical adenopathy.  Neurological: He is alert.  Skin: Skin is warm and dry.     Assessment/Plan History of colonic adenomas. Surveillance colonoscopy.  REHMAN,NAJEEB U 05/02/2014, 9:08 AM

## 2014-05-02 NOTE — Op Note (Signed)
COLONOSCOPY PROCEDURE REPORT  PATIENT:  Calvin Mills  MR#:  [id number] Birthdate:  1940-10-29, 73 y.o., male Endoscopist:  Dr. Rogene Houston, MD Referred By:  Dr. Glo Herring, MD Procedure Date: 05/02/2014  Procedure:   Colonoscopy with snare polypectomy and APC therapy.  Indications:  Patient is 73 year old Caucasian male with history of multiple colonic adenomas who is here for surveillance colonoscopy. He had rectal adenoma with high-grade dysplasia removed in 2004 by me. Follow-up lexical sigmoidoscopy in 2005 revealed no residual polyp. Last colonoscopy in March 2010 was by Dr. Gala Romney revealing large polyp next ileocecal valve. This polyp turned out to be tubulovillous adenoma. It appears he did not have follow-up exam at an earlier date as recommended by him.  Informed Consent:  The procedure and risks were reviewed with the patient and informed consent was obtained.  Medications:  Demerol 50 mg IV Versed 5 mg IV  Description of procedure:  After a digital rectal exam was performed, that colonoscope was advanced from the anus through the rectum and colon to the area of the cecum, ileocecal valve and appendiceal orifice. The cecum was deeply intubated. These structures were well-seen and photographed for the record. From the level of the cecum and ileocecal valve, the scope was slowly and cautiously withdrawn. The mucosal surfaces were carefully surveyed utilizing scope tip to flexion to facilitate fold flattening as needed. The scope was pulled down into the rectum where a thorough exam including retroflexion was performed.  Findings:   Prep satisfactory. Broad-based sessile polyp located at proximal ascending colon encroaching upon right edge of ileocecal valve. This polyp could not be elevated with saline injection. Piecemeal polypectomy performed followed by APC therapy. All of the visible polyp was ablated. 4 mm polyp cold snare from splenic flexure. Moderate number of  diverticula at sigmoid colon. Small hemorrhoids proximal to dentate line.   Therapeutic/Diagnostic Maneuvers Performed:  See above  Complications:  None  Cecal Withdrawal Time:  39 minutes  Impression:  Examination performed to cecum. Large broad-based polyp at proximal ascending colon which appears to be recurrent polyp at previous polypectomy site. This polyp was treated with piecemeal snare polypectomy followed by APC therapy. All of the visible polyp was treated. 4 mm polyp at splenic flexure was cold snared. Moderate sigmoid colon diverticulosis. Internal hemorrhoids.  Recommendations:  Standard instructions given. No aspirin or NSAIDs for 2 weeks. I will be contacting patient with biopsy results and further recommendations.  Rishav Rockefeller U  05/02/2014 10:07 AM  CC: Dr. Glo Herring., MD & Dr. Rayne Du ref. provider found

## 2014-05-02 NOTE — Discharge Instructions (Signed)
No aspirin or NSAIDs for 2 weeks. Resume other medications and high fiber diet. No driving for 24 hours. Physician will call with biopsy results.  Colonoscopy A colonoscopy is an exam to look at the entire large intestine (colon). This exam can help find problems such as tumors, polyps, inflammation, and areas of bleeding. The exam takes about 1 hour.  LET [facility] CARE PROVIDER KNOW ABOUT:   Any allergies you have.  All medicines you are taking, including vitamins, herbs, eye drops, creams, and over-the-counter medicines.  Previous problems you or members of your family have had with the use of anesthetics.  Any blood disorders you have.  Previous surgeries you have had.  Medical conditions you have. RISKS AND COMPLICATIONS  Generally, this is a safe procedure. However, as with any procedure, complications can occur. Possible complications include:  Bleeding.  Tearing or rupture of the colon wall.  Reaction to medicines given during the exam.  Infection (rare). BEFORE THE PROCEDURE   Ask your health care provider about changing or stopping your regular medicines.  You may be prescribed an oral bowel prep. This involves drinking a large amount of medicated liquid, starting the day before your procedure. The liquid will cause you to have multiple loose stools until your stool is almost clear or light green. This cleans out your colon in preparation for the procedure.  Do not eat or drink anything else once you have started the bowel prep, unless your health care provider tells you it is safe to do so.  Arrange for someone to drive you home after the procedure. PROCEDURE   You will be given medicine to help you relax (sedative).  You will lie on your side with your knees bent.  A long, flexible tube with a light and camera on the end (colonoscope) will be inserted through the rectum and into the colon. The camera sends video back to a computer screen as it moves through  the colon. The colonoscope also releases carbon dioxide gas to inflate the colon. This helps your health care provider see the area better.  During the exam, your health care provider may take a small tissue sample (biopsy) to be examined under a microscope if any abnormalities are found.  The exam is finished when the entire colon has been viewed. AFTER THE PROCEDURE   Do not drive for 24 hours after the exam.  You may have a small amount of blood in your stool.  You may pass moderate amounts of gas and have mild abdominal cramping or bloating. This is caused by the gas used to inflate your colon during the exam.  Ask when your test results will be ready and how you will get your results. Make sure you get your test results. Document Released: 05/01/2000 Document Revised: 02/22/2013 Document Reviewed: 01/09/2013 [facility] Patient Information 2015 [place], [place]. This information is not intended to replace advice given to you by your health care provider. Make sure you discuss any questions you have with your health care provider.   High-Fiber Diet Fiber is found in fruits, vegetables, and grains. A high-fiber diet encourages the addition of more whole grains, legumes, fruits, and vegetables in your diet. The recommended amount of fiber for adult males is 38 g per day. For adult females, it is 25 g per day. Pregnant and lactating women should get 28 g of fiber per day. If you have a digestive or bowel problem, ask your caregiver for advice before adding high-fiber foods to  your diet. Eat a variety of high-fiber foods instead of only a select few type of foods.  PURPOSE  To increase stool bulk.  To make bowel movements more regular to prevent constipation.  To lower cholesterol.  To prevent overeating. WHEN IS THIS DIET USED?  It may be used if you have constipation and hemorrhoids.  It may be used if you have uncomplicated diverticulosis (intestine condition) and irritable bowel  syndrome.  It may be used if you need help with weight management.  It may be used if you want to add it to your diet as a protective measure against atherosclerosis, diabetes, and cancer. SOURCES OF FIBER  Whole-grain breads and cereals.  Fruits, such as apples, oranges, bananas, berries, prunes, and pears.  Vegetables, such as green peas, carrots, sweet potatoes, beets, broccoli, cabbage, spinach, and artichokes.  Legumes, such split peas, soy, lentils.  Almonds. FIBER CONTENT IN FOODS Starches and Grains / Dietary Fiber (g)  Cheerios, 1 cup / 3 g  Corn Flakes cereal, 1 cup / 0.7 g  Rice crispy treat cereal, 1 cup / 0.3 g  Instant oatmeal (cooked),  cup / 2 g  Frosted wheat cereal, 1 cup / 5.1 g  Brown, long-grain rice (cooked), 1 cup / 3.5 g  White, long-grain rice (cooked), 1 cup / 0.6 g  Enriched macaroni (cooked), 1 cup / 2.5 g Legumes / Dietary Fiber (g)  Baked beans (canned, plain, or vegetarian),  cup / 5.2 g  Kidney beans (canned),  cup / 6.8 g  Pinto beans (cooked),  cup / 5.5 g Breads and Crackers / Dietary Fiber (g)  Plain or honey graham crackers, 2 squares / 0.7 g  Saltine crackers, 3 squares / 0.3 g  Plain, salted pretzels, 10 pieces / 1.8 g  Whole-wheat bread, 1 slice / 1.9 g  White bread, 1 slice / 0.7 g  Raisin bread, 1 slice / 1.2 g  Plain bagel, 3 oz / 2 g  Flour tortilla, 1 oz / 0.9 g  Corn tortilla, 1 small / 1.5 g  Hamburger or hotdog bun, 1 small / 0.9 g Fruits / Dietary Fiber (g)  Apple with skin, 1 medium / 4.4 g  Sweetened applesauce,  cup / 1.5 g  Banana,  medium / 1.5 g  Grapes, 10 grapes / 0.4 g  Orange, 1 small / 2.3 g  Raisin, 1.5 oz / 1.6 g  Melon, 1 cup / 1.4 g Vegetables / Dietary Fiber (g)  Green beans (canned),  cup / 1.3 g  Carrots (cooked),  cup / 2.3 g  Broccoli (cooked),  cup / 2.8 g  Peas (cooked),  cup / 4.4 g  Mashed potatoes,  cup / 1.6 g  Lettuce, 1 cup / 0.5 g  Corn  (canned),  cup / 1.6 g  Tomato,  cup / 1.1 g Document Released: 05/04/2005 Document Revised: 11/03/2011 Document Reviewed: 08/06/2011 [employer] Patient Information 2015 [place], [place]. This information is not intended to replace advice given to you by your health care provider. Make sure you discuss any questions you have with your health care provider.

## 2014-05-04 ENCOUNTER — Encounter (HOSPITAL_COMMUNITY): Payer: Self-pay | Admitting: Internal Medicine

## 2014-05-15 ENCOUNTER — Encounter (INDEPENDENT_AMBULATORY_CARE_PROVIDER_SITE_OTHER): Payer: Self-pay | Admitting: *Deleted

## 2014-10-01 ENCOUNTER — Encounter (INDEPENDENT_AMBULATORY_CARE_PROVIDER_SITE_OTHER): Payer: Self-pay | Admitting: *Deleted

## 2014-10-02 ENCOUNTER — Ambulatory Visit: Payer: Medicare Other | Admitting: Urology

## 2015-04-08 ENCOUNTER — Telehealth (INDEPENDENT_AMBULATORY_CARE_PROVIDER_SITE_OTHER): Payer: Self-pay | Admitting: *Deleted

## 2015-04-08 DIAGNOSIS — Z1211 Encounter for screening for malignant neoplasm of colon: Secondary | ICD-10-CM

## 2015-04-08 MED ORDER — PEG 3350-KCL-NA BICARB-NACL 420 G PO SOLR: 4000.0000 mL | Freq: Once | ORAL | Status: DC

## 2015-04-08 NOTE — Telephone Encounter (Signed)
Patient needs trilyte 

## 2015-04-10 ENCOUNTER — Other Ambulatory Visit (INDEPENDENT_AMBULATORY_CARE_PROVIDER_SITE_OTHER): Payer: Self-pay | Admitting: *Deleted

## 2015-04-10 DIAGNOSIS — Z86010 Personal history of colonic polyps: Secondary | ICD-10-CM

## 2015-04-17 ENCOUNTER — Telehealth (INDEPENDENT_AMBULATORY_CARE_PROVIDER_SITE_OTHER): Payer: Self-pay | Admitting: *Deleted

## 2015-04-17 NOTE — Telephone Encounter (Signed)
Referring MD/PCP: fusco   Procedure: tcs  Reason/Indication:  Hx polyps  Has patient had this procedure before?  Yes, 04/2014 -- epic  If so, when, by whom and where?    Is there a family history of colon cancer?  no  Who?  What age when diagnosed?    Is patient diabetic?   yes      Does patient have prosthetic heart valve or mechanical valve?  no  Do you have a pacemaker?  no  Has patient ever had endocarditis? no  Has patient had joint replacement within last 12 months?  no  Does patient tend to be constipated or take laxatives? no  Does patient have a history of alcohol/drug use?  no  Is patient on Coumadin, Plavix and/or Aspirin? yes  Medications: asa 81 mg daily, glipizide 10 mg daily, metformin 1000 mg daily, lisinopril/hctz 20/25 mg daily, amlodipine 10 mg daily  Allergies: nkda  Medication Adjustment: asa 2 days, hold DM meds morning of procedure  Procedure date & time: 05/14/15 at 930

## 2015-04-22 NOTE — Telephone Encounter (Signed)
agree

## 2015-04-30 ENCOUNTER — Other Ambulatory Visit: Payer: Self-pay | Admitting: Urology

## 2015-04-30 ENCOUNTER — Ambulatory Visit (INDEPENDENT_AMBULATORY_CARE_PROVIDER_SITE_OTHER): Payer: Medicare Other | Admitting: Urology

## 2015-04-30 DIAGNOSIS — N402 Nodular prostate without lower urinary tract symptoms: Secondary | ICD-10-CM | POA: Diagnosis not present

## 2015-04-30 DIAGNOSIS — R972 Elevated prostate specific antigen [PSA]: Secondary | ICD-10-CM

## 2015-05-14 ENCOUNTER — Encounter (HOSPITAL_COMMUNITY)
Admission: RE | Disposition: A | Discharge status: Home / Self Care | Payer: Self-pay | Source: Ambulatory Visit | Attending: Internal Medicine

## 2015-05-14 ENCOUNTER — Encounter (HOSPITAL_COMMUNITY): Payer: Self-pay | Admitting: *Deleted

## 2015-05-14 ENCOUNTER — Ambulatory Visit (HOSPITAL_COMMUNITY)
Admission: RE | Admit: 2015-05-14 | Discharge: 2015-05-14 | Disposition: A | Discharge status: Home / Self Care | Payer: Medicare Other | Source: Ambulatory Visit | Attending: Internal Medicine | Admitting: Internal Medicine

## 2015-05-14 DIAGNOSIS — K644 Residual hemorrhoidal skin tags: Secondary | ICD-10-CM | POA: Diagnosis not present

## 2015-05-14 DIAGNOSIS — Z79899 Other long term (current) drug therapy: Secondary | ICD-10-CM | POA: Diagnosis not present

## 2015-05-14 DIAGNOSIS — Z7982 Long term (current) use of aspirin: Secondary | ICD-10-CM | POA: Diagnosis not present

## 2015-05-14 DIAGNOSIS — Z86010 Personal history of colonic polyps: Secondary | ICD-10-CM | POA: Diagnosis not present

## 2015-05-14 DIAGNOSIS — Z7984 Long term (current) use of oral hypoglycemic drugs: Secondary | ICD-10-CM | POA: Insufficient documentation

## 2015-05-14 DIAGNOSIS — D122 Benign neoplasm of ascending colon: Secondary | ICD-10-CM | POA: Diagnosis not present

## 2015-05-14 DIAGNOSIS — K648 Other hemorrhoids: Secondary | ICD-10-CM | POA: Diagnosis not present

## 2015-05-14 DIAGNOSIS — E7800 Pure hypercholesterolemia, unspecified: Secondary | ICD-10-CM | POA: Insufficient documentation

## 2015-05-14 DIAGNOSIS — I10 Essential (primary) hypertension: Secondary | ICD-10-CM | POA: Diagnosis not present

## 2015-05-14 DIAGNOSIS — Z1211 Encounter for screening for malignant neoplasm of colon: Secondary | ICD-10-CM | POA: Insufficient documentation

## 2015-05-14 DIAGNOSIS — E119 Type 2 diabetes mellitus without complications: Secondary | ICD-10-CM | POA: Insufficient documentation

## 2015-05-14 DIAGNOSIS — K5730 Diverticulosis of large intestine without perforation or abscess without bleeding: Secondary | ICD-10-CM | POA: Insufficient documentation

## 2015-05-14 DIAGNOSIS — Z87891 Personal history of nicotine dependence: Secondary | ICD-10-CM | POA: Diagnosis not present

## 2015-05-14 HISTORY — PX: COLONOSCOPY: SHX5424

## 2015-05-14 LAB — GLUCOSE, CAPILLARY: Glucose-Capillary: 186 mg/dL — ABNORMAL HIGH (ref 65–99)

## 2015-05-14 SURGERY — COLONOSCOPY
Anesthesia: Moderate Sedation

## 2015-05-14 MED ORDER — MEPERIDINE HCL 50 MG/ML IJ SOLN
INTRAMUSCULAR | Status: AC
  Filled 2015-05-14: qty 1

## 2015-05-14 MED ORDER — MIDAZOLAM HCL 5 MG/5ML IJ SOLN
INTRAMUSCULAR | Status: AC
  Filled 2015-05-14: qty 10

## 2015-05-14 MED ORDER — SODIUM CHLORIDE 0.9 % IV SOLN
INTRAVENOUS | Status: DC
  Administered 2015-05-14: 1000 mL via INTRAVENOUS

## 2015-05-14 MED ORDER — MEPERIDINE HCL 50 MG/ML IJ SOLN
INTRAMUSCULAR | Status: DC | PRN
  Administered 2015-05-14: 25 mg via INTRAVENOUS

## 2015-05-14 MED ORDER — MIDAZOLAM HCL 5 MG/5ML IJ SOLN
INTRAMUSCULAR | Status: DC | PRN
  Administered 2015-05-14: 2 mg via INTRAVENOUS

## 2015-05-14 NOTE — Op Note (Signed)
COLONOSCOPY PROCEDURE REPORT  PATIENT:  Calvin Mills  MR#:  [id number] Birthdate:  05-10-1941, 74 y.o., male Endoscopist:  Dr. Rogene Houston, MD Referred By:  Dr. Glo Herring, MD Procedure Date: 05/14/2015  Procedure:   Colonoscopy with snare polypectomy and APC therapy  Indications: Patient is 74 year old Caucasian male with history of colonic adenomas. He was found to have sessile polyp at ascending colon close to ileocecal valve. Was treated with piecemeal polypectomy and APC. He is returning for repeat exam to treat recurrent or residual polyp.  Informed Consent:  The procedure and risks were reviewed with the patient and informed consent was obtained.  Medications:  Demerol 25 mg IV Versed 2 mg IV  Description of procedure:  After a digital rectal exam was performed, that colonoscope was advanced from the anus through the rectum and colon to the area of the cecum, ileocecal valve and appendiceal orifice. The cecum was deeply intubated. These structures were well-seen and photographed for the record. From the level of the cecum and ileocecal valve, the scope was slowly and cautiously withdrawn. The mucosal surfaces were carefully surveyed utilizing scope tip to flexion to facilitate fold flattening as needed. The scope was pulled down into the rectum where a thorough exam including retroflexion was performed.  Findings:   Prep satisfactory. Sessile polyp about 8 x 20 mm noted at ascending colon very close to right edge of ileocecal valve. Scarring noted next to it. Piecemeal polypectomy performed and residual polyp ablated with argon plasma coagulation. Multiple diverticula at sigmoid colon along with few more scattered and rest of the colon. Normal rectal mucosa. Small hemorrhoids below the dentate line.   Therapeutic/Diagnostic Maneuvers Performed:  See above  Complications:  None  EBL: none  Cecal Withdrawal Time:  35 minutes  Impression:  Examination performed to  cecum. Sessile polyp at ascending colon felt to be residual polyp. Polyp was snared piecemeal and rest of the polyp ablated with argon plasma coagulator. Pancolonic diverticulosis. External hemorrhoids.  Recommendations:  Standard instructions given. I will contact patient with biopsy results and further recommendations.  REHMAN,NAJEEB U  05/14/2015 11:09 AM  CC: Dr. Glo Herring., MD & Dr. Rayne Du ref. provider found

## 2015-05-14 NOTE — Discharge Instructions (Signed)
No aspirin or NSAIDs for 10 days. Resume other medications and high fiber diet. No driving for 24 hours. Physician will call with biopsy results. Colonoscopy, Care After Refer to this sheet in the next few weeks. These instructions provide you with information on caring for yourself after your procedure. Your health care provider may also give you more specific instructions. Your treatment has been planned according to current medical practices, but problems sometimes occur. Call your health care provider if you have any problems or questions after your procedure. WHAT TO EXPECT AFTER THE PROCEDURE  After your procedure, it is typical to have the following:  A small amount of blood in your stool.  Moderate amounts of gas and mild abdominal cramping or bloating. HOME CARE INSTRUCTIONS  Do not drive, operate machinery, or sign important documents for 24 hours.  You may shower and resume your regular physical activities, but move at a slower pace for the first 24 hours.  Take frequent rest periods for the first 24 hours.  Walk around or put a warm pack on your abdomen to help reduce abdominal cramping and bloating.  Drink enough fluids to keep your urine clear or pale yellow.  You may resume your normal diet as instructed by your health care provider. Avoid heavy or fried foods that are hard to digest.  Avoid drinking alcohol for 24 hours or as instructed by your health care provider.  Only take over-the-counter or prescription medicines as directed by your health care provider.  If a tissue sample (biopsy) was taken during your procedure:  Do not take aspirin or blood thinners for 7 days, or as instructed by your health care provider.  Do not drink alcohol for 7 days, or as instructed by your health care provider.  Eat soft foods for the first 24 hours. SEEK MEDICAL CARE IF: You have persistent spotting of blood in your stool 2-3 days after the procedure. SEEK IMMEDIATE MEDICAL  CARE IF:  You have more than a small spotting of blood in your stool.  You pass large blood clots in your stool.  Your abdomen is swollen (distended).  You have nausea or vomiting.  You have a fever.  You have increasing abdominal pain that is not relieved with medicine.   This information is not intended to replace advice given to you by your health care provider. Make sure you discuss any questions you have with your health care provider.   Document Released: 12/17/2003 Document Revised: 02/22/2013 Document Reviewed: 01/09/2013 Elsevier Interactive Patient Education 2016 [employer] Colon Polyps Polyps are lumps of extra tissue growing inside the body. Polyps can grow in the large intestine (colon). Most colon polyps are noncancerous (benign). However, some colon polyps can become cancerous over time. Polyps that are larger than a pea may be harmful. To be safe, caregivers remove and test all polyps. CAUSES  Polyps form when mutations in the genes cause your cells to grow and divide even though no more tissue is needed. RISK FACTORS There are a number of risk factors that can increase your chances of getting colon polyps. They include:  Being older than 50 years.  Family history of colon polyps or colon cancer.  Long-term colon diseases, such as colitis or Crohn disease.  Being overweight.  Smoking.  Being inactive.  Drinking too much alcohol. SYMPTOMS  Most small polyps do not cause symptoms. If symptoms are present, they may include:  Blood in the stool. The stool may look dark red or black.  Constipation or diarrhea that lasts longer than 1 week. DIAGNOSIS People often do not know they have polyps until their caregiver finds them during a regular checkup. Your caregiver can use 4 tests to check for polyps:  Digital rectal exam. The caregiver wears gloves and feels inside the rectum. This test would find polyps only in the rectum.  Barium enema. The caregiver  puts a liquid called barium into your rectum before taking X-rays of your colon. Barium makes your colon look white. Polyps are dark, so they are easy to see in the X-ray pictures.  Sigmoidoscopy. A thin, flexible tube (sigmoidoscope) is placed into your rectum. The sigmoidoscope has a light and tiny camera in it. The caregiver uses the sigmoidoscope to look at the last third of your colon.  Colonoscopy. This test is like sigmoidoscopy, but the caregiver looks at the entire colon. This is the most common method for finding and removing polyps. TREATMENT  Any polyps will be removed during a sigmoidoscopy or colonoscopy. The polyps are then tested for cancer. PREVENTION  To help lower your risk of getting more colon polyps:  Eat plenty of fruits and vegetables. Avoid eating fatty foods.  Do not smoke.  Avoid drinking alcohol.  Exercise every day.  Lose weight if recommended by your caregiver.  Eat plenty of calcium and folate. Foods that are rich in calcium include milk, cheese, and broccoli. Foods that are rich in folate include chickpeas, kidney beans, and spinach. HOME CARE INSTRUCTIONS Keep all follow-up appointments as directed by your caregiver. You may need periodic exams to check for polyps. SEEK MEDICAL CARE IF: You notice bleeding during a bowel movement.   This information is not intended to replace advice given to you by your health care provider. Make sure you discuss any questions you have with your health care provider.   Document Released: 01/29/2004 Document Revised: 05/25/2014 Document Reviewed: 07/14/2011 Elsevier Interactive Patient Education [employer].

## 2015-05-14 NOTE — H&P (Signed)
Calvin Mills is an 74 y.o. male.   Chief Complaint: Patient is  here for colonoscopy. HPI: Patient is 74 year old Caucasian male with history of colonic adenomas. Last colonoscopy was in November 2015 with broad-based large polyp at ascending colon treated with piecemeal snare polypectomy and APC. He denies abdominal pain change in bowel habits or rectal bleeding. Family history is negative for CRC.  Past Medical History  Diagnosis Date  . Kidney stone   . Hypertension   . High cholesterol   . Diabetes [facility])     Past Surgical History  Procedure Laterality Date  . Eye surgery      15 yrs ago: torn retina  . Colonoscopy N/A 05/02/2014    Procedure: COLONOSCOPY;  Surgeon: Rogene Houston, MD;  Location: [facility];  Service: Endoscopy;  Laterality: N/A;  145 - moved to 9:30 - Ann notified pt  . Tonsillectomy      History reviewed. No pertinent family history. Social History:  reports that he has quit smoking. His smoking use included Cigarettes. He has a 15 pack-year smoking history. He does not have any smokeless tobacco history on file. He reports that he does not drink alcohol or use illicit drugs.  Allergies: No Known Allergies  Medications Prior to Admission  Medication Sig Dispense Refill  . aspirin EC 81 MG tablet Take 81 mg by mouth every other day.    Marland Kitchen glipiZIDE (GLUCOTROL) 10 MG tablet Take 10 mg by mouth 2 (two) times daily before a meal.    . lisinopril-hydrochlorothiazide (PRINZIDE,ZESTORETIC) 20-25 MG per tablet Take 1 tablet by mouth daily.    . metFORMIN (GLUCOPHAGE) 1000 MG tablet Take 1,000 mg by mouth daily with breakfast.     . polyethylene glycol-electrolytes (NULYTELY/GOLYTELY) 420 G solution Take 4,000 mLs by mouth once. 4000 mL 0    Results for orders placed or performed during the hospital encounter of 05/14/15 (from the past 48 hour(s))  Glucose, capillary     Status: Abnormal   Collection Time: 05/14/15  9:10 AM  Result Value Ref Range   Glucose-Capillary 186 (H) 65 - 99 mg/dL   No results found.  ROS  Blood pressure 138/76, pulse 72, temperature 97.6 F (36.4 C), temperature source Oral, resp. rate 14, height 6' (1.829 m), weight 180 lb (81.647 kg), SpO2 97 %. Physical Exam  Constitutional: He appears well-developed and well-nourished.  HENT:  Mouth/Throat: Oropharynx is clear and moist.  Eyes: Conjunctivae are normal. No scleral icterus.  Neck: No thyromegaly present.  Cardiovascular: Normal rate, regular rhythm and normal heart sounds.   No murmur heard. Respiratory: Effort normal and breath sounds normal.  GI: Soft. He exhibits no distension and no mass. There is no tenderness.  Musculoskeletal: He exhibits no edema.  Lymphadenopathy:    He has no cervical adenopathy.  Neurological: He is alert.  Skin: Skin is warm and dry.     Assessment/Plan History of colonic adenomas. Surveillance colonoscopy.  REHMAN,NAJEEB U 05/14/2015, 10:18 AM

## 2015-05-17 ENCOUNTER — Other Ambulatory Visit: Payer: Self-pay | Admitting: Urology

## 2015-05-17 ENCOUNTER — Encounter (HOSPITAL_COMMUNITY): Payer: Self-pay | Admitting: Internal Medicine

## 2015-05-17 DIAGNOSIS — N402 Nodular prostate without lower urinary tract symptoms: Secondary | ICD-10-CM

## 2015-05-21 ENCOUNTER — Ambulatory Visit (HOSPITAL_COMMUNITY)
Admission: RE | Admit: 2015-05-21 | Discharge: 2015-05-21 | Disposition: A | Discharge status: Home / Self Care | Payer: Medicare Other | Source: Ambulatory Visit | Attending: Urology | Admitting: Urology

## 2015-05-21 DIAGNOSIS — C61 Malignant neoplasm of prostate: Secondary | ICD-10-CM | POA: Insufficient documentation

## 2015-05-21 DIAGNOSIS — N402 Nodular prostate without lower urinary tract symptoms: Secondary | ICD-10-CM

## 2015-05-21 MED ORDER — GENTAMICIN SULFATE 40 MG/ML IJ SOLN
160.0000 mg | Freq: Once | INTRAMUSCULAR | Status: AC
  Administered 2015-05-21: 160 mg via INTRAMUSCULAR

## 2015-05-21 MED ORDER — GENTAMICIN SULFATE 40 MG/ML IJ SOLN
INTRAMUSCULAR | Status: AC
  Administered 2015-05-21: 160 mg via INTRAMUSCULAR
  Filled 2015-05-21: qty 4

## 2015-05-21 MED ORDER — LIDOCAINE HCL (PF) 2 % IJ SOLN
INTRAMUSCULAR | Status: AC
  Administered 2015-05-21: 10 mL
  Filled 2015-05-21: qty 10

## 2015-05-21 MED ORDER — LIDOCAINE HCL (PF) 2 % IJ SOLN
10.0000 mL | Freq: Once | INTRAMUSCULAR | Status: AC
  Administered 2015-05-21: 10 mL

## 2015-05-21 NOTE — Discharge Instructions (Signed)
Transrectal Ultrasound-Guided Biopsy °A transrectal ultrasound-guided biopsy is a procedure to take samples of tissue from your prostate. Ultrasound images are used to guide the procedure. It is usually done to check the prostate gland for cancer. °BEFORE THE PROCEDURE °· Do not eat or drink after midnight on the night before your procedure. °· Take medicines as your doctor tells you. °· Your doctor may have you stop taking some medicines 5-7 days before the procedure. °· You will be given an enema before your procedure. During an enema, a liquid is put into your butt (rectum) to clear out waste. °· You may have lab tests the day of your procedure. °· Make plans to have someone drive you home. °PROCEDURE °· You will be given medicine to help you relax before the procedure. An IV tube will be put into one of your veins. It will be used to give fluids and medicine. °· You will be given medicine to reduce the risk of infection (antibiotic). °· You will be placed on your side. °· A probe with gel will be put in your butt. This is used to take pictures of your prostate and the area around it. °· A medicine to numb the area is put into your prostate. °· A biopsy needle is then inserted and guided to your prostate. °· Samples of prostate tissue are taken. The needle is removed. °· The samples are sent to a lab to be checked. Results are usually back in 2-3 days. °AFTER THE PROCEDURE °· You will be taken to a room where you will be watched until you are doing okay. °· You may have some pain in the area around your butt. You will be given medicines for this. °· You may be able to go home the same day. Sometimes, an overnight stay in the hospital is needed. °  °This information is not intended to replace advice given to you by your health care provider. Make sure you discuss any questions you have with your health care provider. °  °Document Released: 04/22/2009 Document Revised: 05/09/2013 Document Reviewed:  12/21/2012 °Elsevier Interactive Patient Education ©2016 Elsevier Inc. ° °

## 2015-06-11 ENCOUNTER — Institutional Professional Consult (permissible substitution) (INDEPENDENT_AMBULATORY_CARE_PROVIDER_SITE_OTHER): Payer: Medicare Other | Admitting: Urology

## 2015-06-11 DIAGNOSIS — C61 Malignant neoplasm of prostate: Secondary | ICD-10-CM

## 2015-10-08 ENCOUNTER — Ambulatory Visit (INDEPENDENT_AMBULATORY_CARE_PROVIDER_SITE_OTHER): Payer: Medicare Other | Admitting: Urology

## 2015-10-08 DIAGNOSIS — C61 Malignant neoplasm of prostate: Secondary | ICD-10-CM

## 2016-04-14 ENCOUNTER — Ambulatory Visit (INDEPENDENT_AMBULATORY_CARE_PROVIDER_SITE_OTHER): Payer: Medicare Other | Admitting: Urology

## 2016-04-14 DIAGNOSIS — C61 Malignant neoplasm of prostate: Secondary | ICD-10-CM

## 2016-08-04 DIAGNOSIS — H5213 Myopia, bilateral: Secondary | ICD-10-CM | POA: Diagnosis not present

## 2016-08-04 DIAGNOSIS — E1165 Type 2 diabetes mellitus with hyperglycemia: Secondary | ICD-10-CM | POA: Diagnosis not present

## 2016-08-04 DIAGNOSIS — H2513 Age-related nuclear cataract, bilateral: Secondary | ICD-10-CM | POA: Diagnosis not present

## 2016-08-04 DIAGNOSIS — H524 Presbyopia: Secondary | ICD-10-CM | POA: Diagnosis not present

## 2016-08-04 DIAGNOSIS — E119 Type 2 diabetes mellitus without complications: Secondary | ICD-10-CM | POA: Diagnosis not present

## 2016-08-04 DIAGNOSIS — H52203 Unspecified astigmatism, bilateral: Secondary | ICD-10-CM | POA: Diagnosis not present

## 2016-08-07 DIAGNOSIS — R69 Illness, unspecified: Secondary | ICD-10-CM | POA: Diagnosis not present

## 2016-08-29 DIAGNOSIS — R69 Illness, unspecified: Secondary | ICD-10-CM | POA: Diagnosis not present

## 2016-09-09 DIAGNOSIS — Z1389 Encounter for screening for other disorder: Secondary | ICD-10-CM | POA: Diagnosis not present

## 2016-09-09 DIAGNOSIS — E1140 Type 2 diabetes mellitus with diabetic neuropathy, unspecified: Secondary | ICD-10-CM | POA: Diagnosis not present

## 2016-09-09 DIAGNOSIS — Z6826 Body mass index (BMI) 26.0-26.9, adult: Secondary | ICD-10-CM | POA: Diagnosis not present

## 2016-09-09 DIAGNOSIS — E663 Overweight: Secondary | ICD-10-CM | POA: Diagnosis not present

## 2016-09-09 DIAGNOSIS — I10 Essential (primary) hypertension: Secondary | ICD-10-CM | POA: Diagnosis not present

## 2016-10-21 DIAGNOSIS — R69 Illness, unspecified: Secondary | ICD-10-CM | POA: Diagnosis not present

## 2016-11-12 DIAGNOSIS — R69 Illness, unspecified: Secondary | ICD-10-CM | POA: Diagnosis not present

## 2016-12-08 DIAGNOSIS — S90932D Unspecified superficial injury of left great toe, subsequent encounter: Secondary | ICD-10-CM | POA: Diagnosis not present

## 2016-12-08 DIAGNOSIS — K228 Other specified diseases of esophagus: Secondary | ICD-10-CM | POA: Diagnosis not present

## 2016-12-08 DIAGNOSIS — K007 Teething syndrome: Secondary | ICD-10-CM | POA: Diagnosis not present

## 2016-12-08 DIAGNOSIS — Z0000 Encounter for general adult medical examination without abnormal findings: Secondary | ICD-10-CM | POA: Diagnosis not present

## 2016-12-08 DIAGNOSIS — M545 Low back pain: Secondary | ICD-10-CM | POA: Diagnosis not present

## 2016-12-08 DIAGNOSIS — I10 Essential (primary) hypertension: Secondary | ICD-10-CM | POA: Diagnosis not present

## 2016-12-08 DIAGNOSIS — Z6826 Body mass index (BMI) 26.0-26.9, adult: Secondary | ICD-10-CM | POA: Diagnosis not present

## 2016-12-08 DIAGNOSIS — E119 Type 2 diabetes mellitus without complications: Secondary | ICD-10-CM | POA: Diagnosis not present

## 2016-12-08 DIAGNOSIS — H9113 Presbycusis, bilateral: Secondary | ICD-10-CM | POA: Diagnosis not present

## 2016-12-08 DIAGNOSIS — Z7984 Long term (current) use of oral hypoglycemic drugs: Secondary | ICD-10-CM | POA: Diagnosis not present

## 2016-12-15 DIAGNOSIS — E119 Type 2 diabetes mellitus without complications: Secondary | ICD-10-CM | POA: Diagnosis not present

## 2016-12-15 DIAGNOSIS — E782 Mixed hyperlipidemia: Secondary | ICD-10-CM | POA: Diagnosis not present

## 2016-12-15 DIAGNOSIS — Z1389 Encounter for screening for other disorder: Secondary | ICD-10-CM | POA: Diagnosis not present

## 2016-12-15 DIAGNOSIS — Z6825 Body mass index (BMI) 25.0-25.9, adult: Secondary | ICD-10-CM | POA: Diagnosis not present

## 2016-12-15 DIAGNOSIS — N529 Male erectile dysfunction, unspecified: Secondary | ICD-10-CM | POA: Diagnosis not present

## 2016-12-15 DIAGNOSIS — C61 Malignant neoplasm of prostate: Secondary | ICD-10-CM | POA: Diagnosis not present

## 2016-12-15 DIAGNOSIS — I10 Essential (primary) hypertension: Secondary | ICD-10-CM | POA: Diagnosis not present

## 2017-03-01 DIAGNOSIS — E663 Overweight: Secondary | ICD-10-CM | POA: Diagnosis not present

## 2017-03-01 DIAGNOSIS — I10 Essential (primary) hypertension: Secondary | ICD-10-CM | POA: Diagnosis not present

## 2017-03-01 DIAGNOSIS — Z6826 Body mass index (BMI) 26.0-26.9, adult: Secondary | ICD-10-CM | POA: Diagnosis not present

## 2017-03-13 DIAGNOSIS — R69 Illness, unspecified: Secondary | ICD-10-CM | POA: Diagnosis not present

## 2017-03-24 DIAGNOSIS — R69 Illness, unspecified: Secondary | ICD-10-CM | POA: Diagnosis not present

## 2017-04-13 DIAGNOSIS — Z6826 Body mass index (BMI) 26.0-26.9, adult: Secondary | ICD-10-CM | POA: Diagnosis not present

## 2017-04-13 DIAGNOSIS — I2510 Atherosclerotic heart disease of native coronary artery without angina pectoris: Secondary | ICD-10-CM | POA: Diagnosis not present

## 2017-04-13 DIAGNOSIS — E663 Overweight: Secondary | ICD-10-CM | POA: Diagnosis not present

## 2017-04-13 DIAGNOSIS — E1165 Type 2 diabetes mellitus with hyperglycemia: Secondary | ICD-10-CM | POA: Diagnosis not present

## 2017-04-13 DIAGNOSIS — I10 Essential (primary) hypertension: Secondary | ICD-10-CM | POA: Diagnosis not present

## 2017-04-13 DIAGNOSIS — E782 Mixed hyperlipidemia: Secondary | ICD-10-CM | POA: Diagnosis not present

## 2017-04-15 DIAGNOSIS — R69 Illness, unspecified: Secondary | ICD-10-CM | POA: Diagnosis not present

## 2017-08-03 DIAGNOSIS — E1140 Type 2 diabetes mellitus with diabetic neuropathy, unspecified: Secondary | ICD-10-CM | POA: Diagnosis not present

## 2017-08-03 DIAGNOSIS — I10 Essential (primary) hypertension: Secondary | ICD-10-CM | POA: Diagnosis not present

## 2017-08-03 DIAGNOSIS — E782 Mixed hyperlipidemia: Secondary | ICD-10-CM | POA: Diagnosis not present

## 2017-08-03 DIAGNOSIS — B353 Tinea pedis: Secondary | ICD-10-CM | POA: Diagnosis not present

## 2017-08-03 DIAGNOSIS — Z1389 Encounter for screening for other disorder: Secondary | ICD-10-CM | POA: Diagnosis not present

## 2017-08-03 DIAGNOSIS — N200 Calculus of kidney: Secondary | ICD-10-CM | POA: Diagnosis not present

## 2017-08-03 DIAGNOSIS — Z6826 Body mass index (BMI) 26.0-26.9, adult: Secondary | ICD-10-CM | POA: Diagnosis not present

## 2018-03-17 DIAGNOSIS — Z0001 Encounter for general adult medical examination with abnormal findings: Secondary | ICD-10-CM | POA: Diagnosis not present

## 2018-03-17 DIAGNOSIS — Z6825 Body mass index (BMI) 25.0-25.9, adult: Secondary | ICD-10-CM | POA: Diagnosis not present

## 2018-03-17 DIAGNOSIS — Z23 Encounter for immunization: Secondary | ICD-10-CM | POA: Diagnosis not present

## 2018-03-17 DIAGNOSIS — E1140 Type 2 diabetes mellitus with diabetic neuropathy, unspecified: Secondary | ICD-10-CM | POA: Diagnosis not present

## 2018-03-17 DIAGNOSIS — I10 Essential (primary) hypertension: Secondary | ICD-10-CM | POA: Diagnosis not present

## 2018-03-17 DIAGNOSIS — Z1389 Encounter for screening for other disorder: Secondary | ICD-10-CM | POA: Diagnosis not present

## 2018-03-17 DIAGNOSIS — Z0000 Encounter for general adult medical examination without abnormal findings: Secondary | ICD-10-CM | POA: Diagnosis not present

## 2018-05-17 ENCOUNTER — Encounter (INDEPENDENT_AMBULATORY_CARE_PROVIDER_SITE_OTHER): Payer: Self-pay | Admitting: *Deleted

## 2019-03-06 DIAGNOSIS — Z6824 Body mass index (BMI) 24.0-24.9, adult: Secondary | ICD-10-CM | POA: Diagnosis not present

## 2019-03-06 DIAGNOSIS — I10 Essential (primary) hypertension: Secondary | ICD-10-CM | POA: Diagnosis not present

## 2019-06-08 DIAGNOSIS — I10 Essential (primary) hypertension: Secondary | ICD-10-CM | POA: Diagnosis not present

## 2019-06-08 DIAGNOSIS — E1140 Type 2 diabetes mellitus with diabetic neuropathy, unspecified: Secondary | ICD-10-CM | POA: Diagnosis not present

## 2019-06-08 DIAGNOSIS — E119 Type 2 diabetes mellitus without complications: Secondary | ICD-10-CM | POA: Diagnosis not present

## 2019-06-08 DIAGNOSIS — E782 Mixed hyperlipidemia: Secondary | ICD-10-CM | POA: Diagnosis not present

## 2019-06-08 DIAGNOSIS — C61 Malignant neoplasm of prostate: Secondary | ICD-10-CM | POA: Diagnosis not present

## 2019-06-08 DIAGNOSIS — Z6824 Body mass index (BMI) 24.0-24.9, adult: Secondary | ICD-10-CM | POA: Diagnosis not present

## 2019-06-08 DIAGNOSIS — E7849 Other hyperlipidemia: Secondary | ICD-10-CM | POA: Diagnosis not present

## 2019-06-08 DIAGNOSIS — Z1389 Encounter for screening for other disorder: Secondary | ICD-10-CM | POA: Diagnosis not present

## 2019-06-18 DIAGNOSIS — E785 Hyperlipidemia, unspecified: Secondary | ICD-10-CM | POA: Diagnosis not present

## 2019-06-18 DIAGNOSIS — I10 Essential (primary) hypertension: Secondary | ICD-10-CM | POA: Diagnosis not present

## 2019-06-18 DIAGNOSIS — E1165 Type 2 diabetes mellitus with hyperglycemia: Secondary | ICD-10-CM | POA: Diagnosis not present

## 2019-07-16 DIAGNOSIS — E1165 Type 2 diabetes mellitus with hyperglycemia: Secondary | ICD-10-CM | POA: Diagnosis not present

## 2019-07-16 DIAGNOSIS — I10 Essential (primary) hypertension: Secondary | ICD-10-CM | POA: Diagnosis not present

## 2019-07-21 ENCOUNTER — Ambulatory Visit: Payer: Medicare Other | Attending: Internal Medicine

## 2019-07-21 ENCOUNTER — Other Ambulatory Visit: Payer: Self-pay

## 2019-07-21 DIAGNOSIS — Z23 Encounter for immunization: Secondary | ICD-10-CM | POA: Insufficient documentation

## 2019-07-21 NOTE — Progress Notes (Signed)
   Covid-19 Vaccination Clinic  Name:  Calvin Mills    MRN: [id number] DOB: 13-Nov-1940  07/21/2019  [name] was observed post Covid-19 immunization for 15 minutes without incident. He was provided with Vaccine Information Sheet and instruction to access the V-Safe system.   [name] was instructed to call 911 with any severe reactions post vaccine: Marland Kitchen Difficulty breathing  . Swelling of face and throat  . A fast heartbeat  . A bad rash all over body  . Dizziness and weakness   Immunizations Administered    Name Date Dose VIS Date Route   Moderna COVID-19 Vaccine 07/21/2019  8:53 AM 0.5 mL 04/18/2019 Intramuscular   Manufacturer: Moderna   LotQN:[id number]   [place]:[id number]

## 2019-08-22 ENCOUNTER — Ambulatory Visit: Payer: Medicare Other | Attending: Internal Medicine

## 2019-08-22 DIAGNOSIS — Z23 Encounter for immunization: Secondary | ICD-10-CM

## 2019-08-22 NOTE — Progress Notes (Signed)
   Covid-19 Vaccination Clinic  Name:  Calvin Mills    MRN: [id number] DOB: [birth year]  08/22/2019  [name] was observed post Covid-19 immunization for 15 minutes without incident. He was provided with Vaccine Information Sheet and instruction to access the V-Safe system.   [name] was instructed to call 911 with any severe reactions post vaccine: Marland Kitchen Difficulty breathing  . Swelling of face and throat  . A fast heartbeat  . A bad rash all over body  . Dizziness and weakness   Immunizations Administered    Name Date Dose VIS Date Route   Moderna COVID-19 Vaccine 08/22/2019  9:49 AM 0.5 mL 04/18/2019 Intramuscular   Manufacturer: Moderna   Lot: [id number]   [place]DW:[id number]

## 2019-10-03 DIAGNOSIS — Z1389 Encounter for screening for other disorder: Secondary | ICD-10-CM | POA: Diagnosis not present

## 2019-10-03 DIAGNOSIS — E1140 Type 2 diabetes mellitus with diabetic neuropathy, unspecified: Secondary | ICD-10-CM | POA: Diagnosis not present

## 2019-10-03 DIAGNOSIS — I10 Essential (primary) hypertension: Secondary | ICD-10-CM | POA: Diagnosis not present

## 2019-10-03 DIAGNOSIS — Z6824 Body mass index (BMI) 24.0-24.9, adult: Secondary | ICD-10-CM | POA: Diagnosis not present

## 2019-10-03 DIAGNOSIS — E1165 Type 2 diabetes mellitus with hyperglycemia: Secondary | ICD-10-CM | POA: Diagnosis not present

## 2019-10-03 DIAGNOSIS — Z0000 Encounter for general adult medical examination without abnormal findings: Secondary | ICD-10-CM | POA: Diagnosis not present

## 2019-11-15 DIAGNOSIS — Z7984 Long term (current) use of oral hypoglycemic drugs: Secondary | ICD-10-CM | POA: Diagnosis not present

## 2019-11-15 DIAGNOSIS — E113291 Type 2 diabetes mellitus with mild nonproliferative diabetic retinopathy without macular edema, right eye: Secondary | ICD-10-CM | POA: Diagnosis not present

## 2019-11-15 DIAGNOSIS — I10 Essential (primary) hypertension: Secondary | ICD-10-CM | POA: Diagnosis not present

## 2019-11-15 DIAGNOSIS — E1165 Type 2 diabetes mellitus with hyperglycemia: Secondary | ICD-10-CM | POA: Diagnosis not present

## 2020-02-15 DIAGNOSIS — I10 Essential (primary) hypertension: Secondary | ICD-10-CM | POA: Diagnosis not present

## 2020-02-15 DIAGNOSIS — E1165 Type 2 diabetes mellitus with hyperglycemia: Secondary | ICD-10-CM | POA: Diagnosis not present

## 2020-02-15 DIAGNOSIS — E785 Hyperlipidemia, unspecified: Secondary | ICD-10-CM | POA: Diagnosis not present

## 2020-03-16 DIAGNOSIS — E7849 Other hyperlipidemia: Secondary | ICD-10-CM | POA: Diagnosis not present

## 2020-03-16 DIAGNOSIS — I10 Essential (primary) hypertension: Secondary | ICD-10-CM | POA: Diagnosis not present

## 2020-03-16 DIAGNOSIS — E1165 Type 2 diabetes mellitus with hyperglycemia: Secondary | ICD-10-CM | POA: Diagnosis not present

## 2020-04-16 DIAGNOSIS — E1165 Type 2 diabetes mellitus with hyperglycemia: Secondary | ICD-10-CM | POA: Diagnosis not present

## 2020-04-16 DIAGNOSIS — E7849 Other hyperlipidemia: Secondary | ICD-10-CM | POA: Diagnosis not present

## 2020-04-16 DIAGNOSIS — I10 Essential (primary) hypertension: Secondary | ICD-10-CM | POA: Diagnosis not present

## 2020-05-17 DIAGNOSIS — E1165 Type 2 diabetes mellitus with hyperglycemia: Secondary | ICD-10-CM | POA: Diagnosis not present

## 2020-05-17 DIAGNOSIS — E785 Hyperlipidemia, unspecified: Secondary | ICD-10-CM | POA: Diagnosis not present

## 2020-05-17 DIAGNOSIS — I10 Essential (primary) hypertension: Secondary | ICD-10-CM | POA: Diagnosis not present

## 2020-07-24 DIAGNOSIS — E1165 Type 2 diabetes mellitus with hyperglycemia: Secondary | ICD-10-CM | POA: Diagnosis not present

## 2020-07-24 DIAGNOSIS — E785 Hyperlipidemia, unspecified: Secondary | ICD-10-CM | POA: Diagnosis not present

## 2020-07-24 DIAGNOSIS — E113299 Type 2 diabetes mellitus with mild nonproliferative diabetic retinopathy without macular edema, unspecified eye: Secondary | ICD-10-CM | POA: Diagnosis not present

## 2020-07-24 DIAGNOSIS — Z6824 Body mass index (BMI) 24.0-24.9, adult: Secondary | ICD-10-CM | POA: Diagnosis not present

## 2020-07-24 DIAGNOSIS — E113291 Type 2 diabetes mellitus with mild nonproliferative diabetic retinopathy without macular edema, right eye: Secondary | ICD-10-CM | POA: Diagnosis not present

## 2020-08-23 DIAGNOSIS — L603 Nail dystrophy: Secondary | ICD-10-CM | POA: Diagnosis not present

## 2020-08-23 DIAGNOSIS — E1142 Type 2 diabetes mellitus with diabetic polyneuropathy: Secondary | ICD-10-CM | POA: Diagnosis not present

## 2020-12-23 DIAGNOSIS — I10 Essential (primary) hypertension: Secondary | ICD-10-CM | POA: Diagnosis not present

## 2020-12-23 DIAGNOSIS — Z6822 Body mass index (BMI) 22.0-22.9, adult: Secondary | ICD-10-CM | POA: Diagnosis not present

## 2020-12-23 DIAGNOSIS — M1991 Primary osteoarthritis, unspecified site: Secondary | ICD-10-CM | POA: Diagnosis not present

## 2020-12-23 DIAGNOSIS — E1140 Type 2 diabetes mellitus with diabetic neuropathy, unspecified: Secondary | ICD-10-CM | POA: Diagnosis not present

## 2021-05-28 ENCOUNTER — Encounter (HOSPITAL_COMMUNITY): Payer: Self-pay | Admitting: Emergency Medicine

## 2021-05-28 ENCOUNTER — Emergency Department (HOSPITAL_COMMUNITY): Payer: Medicare HMO

## 2021-05-28 ENCOUNTER — Observation Stay (HOSPITAL_COMMUNITY)
Admission: EM | Admit: 2021-05-28 | Discharge: 2021-05-29 | Disposition: A | Discharge status: Home / Self Care | Payer: Medicare HMO | Attending: Family Medicine | Admitting: Family Medicine

## 2021-05-28 ENCOUNTER — Other Ambulatory Visit: Payer: Self-pay

## 2021-05-28 DIAGNOSIS — I10 Essential (primary) hypertension: Secondary | ICD-10-CM | POA: Diagnosis present

## 2021-05-28 DIAGNOSIS — R4182 Altered mental status, unspecified: Secondary | ICD-10-CM

## 2021-05-28 DIAGNOSIS — R4781 Slurred speech: Secondary | ICD-10-CM | POA: Diagnosis not present

## 2021-05-28 DIAGNOSIS — I639 Cerebral infarction, unspecified: Secondary | ICD-10-CM | POA: Diagnosis not present

## 2021-05-28 DIAGNOSIS — R531 Weakness: Secondary | ICD-10-CM

## 2021-05-28 DIAGNOSIS — R29810 Facial weakness: Secondary | ICD-10-CM

## 2021-05-28 DIAGNOSIS — E119 Type 2 diabetes mellitus without complications: Secondary | ICD-10-CM | POA: Insufficient documentation

## 2021-05-28 DIAGNOSIS — Z20822 Contact with and (suspected) exposure to covid-19: Secondary | ICD-10-CM | POA: Insufficient documentation

## 2021-05-28 DIAGNOSIS — R29702 NIHSS score 2: Secondary | ICD-10-CM | POA: Diagnosis not present

## 2021-05-28 DIAGNOSIS — Z79899 Other long term (current) drug therapy: Secondary | ICD-10-CM | POA: Insufficient documentation

## 2021-05-28 DIAGNOSIS — Z7984 Long term (current) use of oral hypoglycemic drugs: Secondary | ICD-10-CM | POA: Diagnosis not present

## 2021-05-28 DIAGNOSIS — E1169 Type 2 diabetes mellitus with other specified complication: Secondary | ICD-10-CM | POA: Diagnosis not present

## 2021-05-28 DIAGNOSIS — Z87891 Personal history of nicotine dependence: Secondary | ICD-10-CM | POA: Insufficient documentation

## 2021-05-28 DIAGNOSIS — E785 Hyperlipidemia, unspecified: Secondary | ICD-10-CM | POA: Diagnosis not present

## 2021-05-28 DIAGNOSIS — I6601 Occlusion and stenosis of right middle cerebral artery: Secondary | ICD-10-CM | POA: Diagnosis not present

## 2021-05-28 DIAGNOSIS — R000 Tachycardia, unspecified: Secondary | ICD-10-CM | POA: Diagnosis not present

## 2021-05-28 DIAGNOSIS — R739 Hyperglycemia, unspecified: Secondary | ICD-10-CM

## 2021-05-28 DIAGNOSIS — R262 Difficulty in walking, not elsewhere classified: Secondary | ICD-10-CM | POA: Diagnosis not present

## 2021-05-28 DIAGNOSIS — G319 Degenerative disease of nervous system, unspecified: Secondary | ICD-10-CM | POA: Diagnosis not present

## 2021-05-28 LAB — CBC
HCT: 44.6 % (ref 39.0–52.0)
Hemoglobin: 15.9 g/dL (ref 13.0–17.0)
MCH: 33.3 pg (ref 26.0–34.0)
MCHC: 35.7 g/dL (ref 30.0–36.0)
MCV: 93.3 fL (ref 80.0–100.0)
Platelets: 187 10*3/uL (ref 150–400)
RBC: 4.78 MIL/uL (ref 4.22–5.81)
RDW: 12.5 % (ref 11.5–15.5)
WBC: 7.6 10*3/uL (ref 4.0–10.5)
nRBC: 0 % (ref 0.0–0.2)

## 2021-05-28 LAB — COMPREHENSIVE METABOLIC PANEL
ALT: 19 U/L (ref 0–44)
AST: 20 U/L (ref 15–41)
Albumin: 3.9 g/dL (ref 3.5–5.0)
Alkaline Phosphatase: 78 U/L (ref 38–126)
Anion gap: 12 (ref 5–15)
BUN: 17 mg/dL (ref 8–23)
CO2: 23 mmol/L (ref 22–32)
Calcium: 8.5 mg/dL — ABNORMAL LOW (ref 8.9–10.3)
Chloride: 99 mmol/L (ref 98–111)
Creatinine, Ser: 0.87 mg/dL (ref 0.61–1.24)
GFR, Estimated: >60 mL/min (ref >60)
Glucose, Bld: 381 mg/dL — ABNORMAL HIGH (ref 70–99)
Potassium: 3.7 mmol/L (ref 3.5–5.1)
Sodium: 134 mmol/L — ABNORMAL LOW (ref 135–145)
Total Bilirubin: 1 mg/dL (ref 0.3–1.2)
Total Protein: 6.7 g/dL (ref 6.5–8.1)

## 2021-05-28 LAB — RESP PANEL BY RT-PCR (FLU A&B, COVID) ARPGX2
Influenza A by PCR: NEGATIVE
Influenza B by PCR: NEGATIVE
SARS Coronavirus 2 by RT PCR: NEGATIVE

## 2021-05-28 LAB — I-STAT CHEM 8, ED
BUN: 16 mg/dL (ref 8–23)
Calcium, Ion: 1.02 mmol/L — ABNORMAL LOW (ref 1.15–1.40)
Chloride: 100 mmol/L (ref 98–111)
Creatinine, Ser: 0.7 mg/dL (ref 0.61–1.24)
Glucose, Bld: 379 mg/dL — ABNORMAL HIGH (ref 70–99)
HCT: 47 % (ref 39.0–52.0)
Hemoglobin: 16 g/dL (ref 13.0–17.0)
Potassium: 3.7 mmol/L (ref 3.5–5.1)
Sodium: 135 mmol/L (ref 135–145)
TCO2: 23 mmol/L (ref 22–32)

## 2021-05-28 LAB — DIFFERENTIAL
Abs Immature Granulocytes: 0.05 10*3/uL (ref 0.00–0.07)
Basophils Absolute: 0 10*3/uL (ref 0.0–0.1)
Basophils Relative: 0 %
Eosinophils Absolute: 0.1 10*3/uL (ref 0.0–0.5)
Eosinophils Relative: 1 %
Immature Granulocytes: 1 %
Lymphocytes Relative: 24 %
Lymphs Abs: 1.8 10*3/uL (ref 0.7–4.0)
Monocytes Absolute: 0.8 10*3/uL (ref 0.1–1.0)
Monocytes Relative: 10 %
Neutro Abs: 4.9 10*3/uL (ref 1.7–7.7)
Neutrophils Relative %: 64 %

## 2021-05-28 LAB — PROTIME-INR
INR: 1 (ref 0.8–1.2)
Prothrombin Time: 13.3 seconds (ref 11.4–15.2)

## 2021-05-28 LAB — CBG MONITORING, ED: Glucose-Capillary: 342 mg/dL — ABNORMAL HIGH (ref 70–99)

## 2021-05-28 LAB — ETHANOL: Alcohol, Ethyl (B): <10 mg/dL (ref <10)

## 2021-05-28 LAB — APTT: aPTT: 28 seconds (ref 24–36)

## 2021-05-28 MED ORDER — ACETAMINOPHEN 650 MG RE SUPP: 650.0000 mg | RECTAL | Status: DC | PRN

## 2021-05-28 MED ORDER — SENNOSIDES-DOCUSATE SODIUM 8.6-50 MG PO TABS: 1.0000 | ORAL_TABLET | Freq: Every evening | ORAL | Status: DC | PRN

## 2021-05-28 MED ORDER — ASPIRIN EC 81 MG PO TBEC
81.0000 mg | DELAYED_RELEASE_TABLET | Freq: Every day | ORAL | Status: DC
  Administered 2021-05-29: 81 mg via ORAL
  Filled 2021-05-28: qty 1

## 2021-05-28 MED ORDER — ASPIRIN 325 MG PO TABS
325.0000 mg | ORAL_TABLET | Freq: Once | ORAL | Status: AC
  Administered 2021-05-28: 325 mg via ORAL
  Filled 2021-05-28: qty 1

## 2021-05-28 MED ORDER — INSULIN ASPART 100 UNIT/ML IJ SOLN
0.0000 [IU] | Freq: Every day | INTRAMUSCULAR | Status: DC
  Administered 2021-05-28: 3 [IU] via SUBCUTANEOUS

## 2021-05-28 MED ORDER — CLOPIDOGREL BISULFATE 75 MG PO TABS
300.0000 mg | ORAL_TABLET | Freq: Once | ORAL | Status: AC
  Administered 2021-05-28: 300 mg via ORAL
  Filled 2021-05-28: qty 4

## 2021-05-28 MED ORDER — STROKE: EARLY STAGES OF RECOVERY BOOK: Freq: Once | Status: AC

## 2021-05-28 MED ORDER — ACETAMINOPHEN 325 MG PO TABS: 650.0000 mg | ORAL_TABLET | ORAL | Status: DC | PRN

## 2021-05-28 MED ORDER — LABETALOL HCL 5 MG/ML IV SOLN: 10.0000 mg | INTRAVENOUS | Status: DC | PRN

## 2021-05-28 MED ORDER — ACETAMINOPHEN 160 MG/5ML PO SOLN: 650.0000 mg | ORAL | Status: DC | PRN

## 2021-05-28 MED ORDER — INSULIN ASPART 100 UNIT/ML IJ SOLN
0.0000 [IU] | Freq: Three times a day (TID) | INTRAMUSCULAR | Status: DC
  Administered 2021-05-29: 8 [IU] via SUBCUTANEOUS
  Administered 2021-05-29: 11 [IU] via SUBCUTANEOUS

## 2021-05-28 MED ORDER — CLOPIDOGREL BISULFATE 75 MG PO TABS
75.0000 mg | ORAL_TABLET | Freq: Every day | ORAL | Status: DC
  Administered 2021-05-29: 75 mg via ORAL
  Filled 2021-05-28: qty 1

## 2021-05-28 MED ORDER — ENOXAPARIN SODIUM 40 MG/0.4ML IJ SOSY
40.0000 mg | PREFILLED_SYRINGE | INTRAMUSCULAR | Status: DC
  Administered 2021-05-28: 40 mg via SUBCUTANEOUS
  Filled 2021-05-28: qty 0.4

## 2021-05-28 MED ORDER — LORAZEPAM 2 MG/ML IJ SOLN
0.5000 mg | Freq: Once | INTRAMUSCULAR | Status: AC
  Administered 2021-05-29: 0.5 mg via INTRAVENOUS
  Filled 2021-05-28: qty 1

## 2021-05-28 NOTE — Plan of Care (Signed)
°  Problem: Education: Goal: Knowledge of General Education information will improve Description: Including pain rating scale, medication(s)/side effects and non-pharmacologic comfort measures Outcome: Progressing   Problem: Health Behavior/Discharge Planning: Goal: Ability to manage health-related needs will improve Outcome: Progressing   Problem: Clinical Measurements: Goal: Ability to maintain clinical measurements within normal limits will improve Outcome: Progressing Goal: Will remain free from infection Outcome: Progressing Goal: Diagnostic test results will improve Outcome: Progressing Goal: Respiratory complications will improve Outcome: Progressing Goal: Cardiovascular complication will be avoided Outcome: Progressing   Problem: Activity: Goal: Risk for activity intolerance will decrease Outcome: Progressing   Problem: Nutrition: Goal: Adequate nutrition will be maintained Outcome: Progressing   Problem: Coping: Goal: Level of anxiety will decrease Outcome: Progressing   Problem: Elimination: Goal: Will not experience complications related to bowel motility Outcome: Progressing Goal: Will not experience complications related to urinary retention Outcome: Progressing   Problem: Pain Managment: Goal: General experience of comfort will improve Outcome: Progressing   Problem: Safety: Goal: Ability to remain free from injury will improve Outcome: Progressing   Problem: Skin Integrity: Goal: Risk for impaired skin integrity will decrease Outcome: Progressing   Problem: Education: Goal: Knowledge of disease or condition will improve Outcome: Progressing Goal: Knowledge of secondary prevention will improve (SELECT ALL) Outcome: Progressing Goal: Knowledge of patient specific risk factors will improve (INDIVIDUALIZE FOR PATIENT) Outcome: Progressing Goal: Individualized Educational Video(s) Outcome: Progressing   Problem: Self-Care: Goal: Ability to  participate in self-care as condition permits will improve Outcome: Progressing Goal: Verbalization of feelings and concerns over difficulty with self-care will improve Outcome: Progressing Goal: Ability to communicate needs accurately will improve Outcome: Progressing

## 2021-05-28 NOTE — ED Notes (Signed)
Patient transported to MRI 

## 2021-05-28 NOTE — ED Triage Notes (Signed)
Pt to the ED with [facility] after being called out by the [facility] staff.   EMS reports he ate his meal alone with a LKW time of 1650.  Pt became weak and unable to walk according to restaurant staff.. EMS reports left sided facial droop, altered speech, and leg weakness.

## 2021-05-28 NOTE — ED Provider Notes (Addendum)
[facility] Provider Note   CSN: [id number] Arrival date & time: 05/28/21  1714     History  Chief Complaint  Patient presents with   Code Stroke    Calvin Mills is a 81 y.o. male.  Patient brought in by EMS for presumed stroke.  Little if you when patient was last known normal.  But the restaurant where the patient was in [place] he was dining by himself.  Seem to place the mental status change at around 1650.  Patient got up after finishing his meal went to the bathroom.  Left the bathroom and went and sat down outside.  The restaurant checked on him he seemed to be confused he was unable to walk.  EMS was called.  EMS thought there was some left-sided facial droop.  Patient able to say he had beef tips.  But not able to remember his age or other things.  And they tried to walk him and he was totally unstable with walking.  Plus he has had stool incontinence.  Patient denies any visual changes.  Speech could be off but difficult to say for sure.  In triage area patient was able to raise both arms holding steady and raise the legs without any significant weakness.  There did appear to be some left facial droop.  And questionable speech problem.  Patient's eyes will track normally.  And his tongue would track normally.  Based on patient's past medicines.  He has a history of diabetes as well as hypertension.      Home Medications Prior to Admission medications   Medication Sig Start Date End Date Taking? Authorizing Provider  glipiZIDE (GLUCOTROL) 10 MG tablet Take 10 mg by mouth 2 (two) times daily before a meal.    [provider]  lisinopril-hydrochlorothiazide (PRINZIDE,ZESTORETIC) 20-25 MG per tablet Take 1 tablet by mouth daily.    [provider]  metFORMIN (GLUCOPHAGE) 1000 MG tablet Take 1,000 mg by mouth daily with breakfast.     [provider]      Allergies    Patient has no known allergies.    Review of Systems   Review  of Systems  Unable to perform ROS: Mental status change  Psychiatric/Behavioral:  Positive for confusion.    Physical Exam Updated Vital Signs There were no vitals taken for this visit. Physical Exam Vitals and nursing note reviewed.  Constitutional:      General: He is not in acute distress.    Appearance: Normal appearance. He is well-developed.  HENT:     Head: Normocephalic and atraumatic.  Eyes:     Extraocular Movements: Extraocular movements intact.     Conjunctiva/sclera: Conjunctivae normal.     Pupils: Pupils are equal, round, and reactive to light.  Cardiovascular:     Rate and Rhythm: Normal rate and regular rhythm.     Heart sounds: No murmur heard. Pulmonary:     Effort: Pulmonary effort is normal. No respiratory distress.     Breath sounds: Normal breath sounds.  Abdominal:     Palpations: Abdomen is soft.     Tenderness: There is no abdominal tenderness.  Musculoskeletal:        General: No swelling.     Cervical back: Neck supple.  Skin:    General: Skin is warm and dry.     Capillary Refill: Capillary refill takes less than 2 seconds.  Neurological:     Mental Status: He is alert. He is disoriented.  Cranial Nerves: Cranial nerve deficit present.     Gait: Gait abnormal.     Comments: Patient with questionable left-sided facial droop.  Patient able to raise both arms without any drift or weakness.  Able to raise right leg and left leg and hold it without any significant weakness.  According to EMS patient unable to walk however.  Psychiatric:        Mood and Affect: Mood normal.    ED Results / Procedures / Treatments   Labs (all labs ordered are listed, but only abnormal results are displayed) Labs Reviewed  I-STAT CHEM 8, ED - Abnormal; Notable for the following components:      Result Value   Glucose, Bld 379 (*)    Calcium, Ion 1.02 (*)    All other components within normal limits  RESP PANEL BY RT-PCR (FLU A&B, COVID) ARPGX2  ETHANOL   PROTIME-INR  APTT  CBC  DIFFERENTIAL  COMPREHENSIVE METABOLIC PANEL  RAPID URINE DRUG SCREEN, HOSP PERFORMED  URINALYSIS, ROUTINE W REFLEX MICROSCOPIC    EKG None  Radiology CT HEAD CODE STROKE WO CONTRAST  Result Date: 05/28/2021 CLINICAL DATA:  Code stroke. EXAM: CT HEAD WITHOUT CONTRAST TECHNIQUE: Contiguous axial images were obtained from the base of the skull through the vertex without intravenous contrast. RADIATION DOSE REDUCTION: This exam was performed according to the departmental dose-optimization program which includes automated exposure control, adjustment of the mA and/or kV according to patient size and/or use of iterative reconstruction technique. COMPARISON:  No prior CT is available for comparison, correlation is made with 08/04/2007 MRI. FINDINGS: Brain: No evidence of acute infarction, hemorrhage, cerebral edema, mass, mass effect, or midline shift. Ventricles and sulci are normal for age. No extra-axial fluid collection. Periventricular white matter changes, likely the sequela of chronic small vessel ischemic disease. Vascular: No hyperdense vessel or unexpected calcification. Skull: Normal. Negative for fracture or focal lesion. Sinuses/Orbits: No acute finding. Other: The mastoid air cells are well aerated. ASPECTS [facility] Stroke Program Early CT Score) - Ganglionic level infarction (caudate, lentiform nuclei, internal capsule, insula, M1-M3 cortex): 7 - Supraganglionic infarction (M4-M6 cortex): 3 Total score (0-10 with 10 being normal): 10 IMPRESSION: 1. No acute infarct or intracranial hemorrhage. No acute intracranial process. 2. ASPECTS is 10 Code stroke imaging results were communicated on 05/28/2021 at 5:29 pm to provider Dr. Rogene Houston Via telephone, who verbally acknowledged these results. Electronically Signed   By: Merilyn Baba M.D.   On: 05/28/2021 17:30    Procedures Procedures    Medications Ordered in ED Medications - No data to display  ED Course/  Medical Decision Making/ A&P                           Medical Decision Making CRITICAL CARE Performed by: Fredia Sorrow Total critical care time: 45 minutes Critical care time was exclusive of separately billable procedures and treating other patients. Critical care was necessary to treat or prevent imminent or life-threatening deterioration. Critical care was time spent personally by me on the following activities: development of treatment plan with patient and/or surrogate as well as nursing, discussions with consultants, evaluation of patient's response to treatment, examination of patient, obtaining history from patient or surrogate, ordering and performing treatments and interventions, ordering and review of laboratory studies, ordering and review of radiographic studies, pulse oximetry and re-evaluation of patient's condition.  Code stroke activated.  Head CT read by radiology without any acute findings.  Patient  will be evaluated by teleneurology.  Patient seems to have left-sided facial droop.  Has a degree of confusion.  Was unable to walk.  Had stool incontinence.  Patient without any abdominal tenderness lungs are clear no respiratory problems.  Patient's eyes track appropriately.  Able to track with his tongue appropriately.  Last known normal is a little questionable.  But certainly he was able to get into the restaurant fine and eat his meal.  Patient certainly has risk factors based on his medications has diabetes and hypertension.  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  Suspect that neurology will want MRI  Neurology gait patient NIH stroke scale of 2.  For some mild confusion and the facial droop.  They are recommending MRI giving him aspirin giving him Plavix and medical admission here.  I-STAT metabolic panel significant for elevated blood sugar in the upper 300s.  Complete metabolic panel is pending.   Final Clinical Impression(s) /  ED Diagnoses Final diagnoses:  Altered mental status, unspecified altered mental status type  Cerebrovascular accident (CVA), unspecified mechanism [facility])    Rx / DC Orders ED Discharge Orders     None         Fredia Sorrow, MD 05/28/21 1738    Fredia Sorrow, MD 05/28/21 1755

## 2021-05-28 NOTE — H&P (Addendum)
History and Physical    ADAL SERENO NLZ:[id number] DOB: 02/17/1941 DOA: 05/28/2021  PCP: Redmond School, MD   Patient coming from: home  I have personally briefly reviewed patient's old medical records in [place]  Chief Complaint: Left sided weakness  HPI: Calvin Mills is a 81 y.o. male with medical history significant for diabetes mellitus, hypertension. Patient was brought to the ED via EMS reports of strokelike symptoms.  Patient was last known normal 1650. Patient was at [employer], he had just eaten by himself, he went to the restroom came back and was walking out when one of the staff at the restaurant saw him walking, and told him he appeared unsteady on his feet, patient asked if he could go outside and sit down on one of the chairs.  He did, the staff called EMS.  On EMS arrival it was thought that patient's speech was abnormal, with a left facial droop and left-sided weakness and was disoriented On my evaluation patient is awake alert and oriented to person place and situation.  He confirms the above history by telling me, he tells me he did not feel anything was abnormal.  He does not remember feeling confused, he remembers the details of today.  He lives with his daughter Calvin Mills. No history of seizures.  He takes aspirin daily.  ED Course: Stable vitals.  Blood pressure 1 34-1 44.  EKG shows sinus rhythm.  Head CT without acute abnormality.  Tele-neurology was consulted, no tPA, NIHSS 2, nondisabling symptoms definitely candidate for thrombolytics.  Admission requested.  For full stroke work-up.  Review of Systems: As per HPI all other systems reviewed and negative.  Past Medical History:  Diagnosis Date   Diabetes ([place])    High cholesterol    Hypertension    Kidney stone     Past Surgical History:  Procedure Laterality Date   COLONOSCOPY N/A 05/02/2014   Procedure: COLONOSCOPY;  Surgeon: Rogene Houston, MD;  Location: [facility];  Service:  Endoscopy;  Laterality: N/A;  145 - moved to 9:30 - Ann notified pt   COLONOSCOPY N/A 05/14/2015   Procedure: COLONOSCOPY;  Surgeon: Rogene Houston, MD;  Location: [facility] ENDO SUITE;  Service: Endoscopy;  Laterality: N/A;  9:30   EYE SURGERY     15 yrs ago: torn retina   TONSILLECTOMY       reports that he has quit smoking. His smoking use included cigarettes. He has a 15.00 pack-year smoking history. He does not have any smokeless tobacco history on file. He reports that he does not drink alcohol and does not use drugs.  No Known Allergies  Family history of hypertension.  Prior to Admission medications   Medication Sig Start Date End Date Taking? Authorizing Provider  glipiZIDE (GLUCOTROL) 10 MG tablet Take 10 mg by mouth 2 (two) times daily before a meal.    [provider]  lisinopril-hydrochlorothiazide (PRINZIDE,ZESTORETIC) 20-25 MG per tablet Take 1 tablet by mouth daily.    [provider]  metFORMIN (GLUCOPHAGE) 1000 MG tablet Take 1,000 mg by mouth daily with breakfast.     [provider]    Physical Exam: Vitals:   05/28/21 1736 05/28/21 1741 05/28/21 1742 05/28/21 1800  BP: (!) 144/83   134/80  Pulse: 98   94  Resp: 12   19  Temp: 98.4 F (36.9 C) 98.4 F (36.9 C)    TempSrc: Oral Oral    SpO2: 96%   91%  Height:   6' (1.829 m)     Constitutional: NAD, calm, comfortable Vitals:   05/28/21 1736 05/28/21 1741 05/28/21 1742 05/28/21 1800  BP: (!) 144/83   134/80  Pulse: 98   94  Resp: 12   19  Temp: 98.4 F (36.9 C) 98.4 F (36.9 C)    TempSrc: Oral Oral    SpO2: 96%   91%  Height:   6' (1.829 m)    Eyes: PERRL, lids and conjunctivae normal ENMT: poor dentition, likely causing facial assymetry. Mucous membranes are moist.  Neck: normal, supple, no masses, no thyromegaly Respiratory: clear to auscultation bilaterally, no wheezing, no crackles. Normal respiratory effort. No accessory muscle use.  Cardiovascular: Regular rate and  rhythm, no murmurs / rubs / gallops.  No lower extremity edema, patient unaware of chronicity, says he has not been paying attention to it..  Lower extremities warm.  Abdomen: no tenderness, no masses palpated. No hepatosplenomegaly. Bowel sounds positive.  Musculoskeletal: no clubbing / cyanosis. No joint deformity upper and lower extremities. Good ROM, no contractures. Normal muscle tone.  Skin: no rashes, lesions, ulcers. No induration Neurologic:  Neurological:     Mental Status: he is alert.     GCS: GCS eye subscore is 4. GCS verbal subscore is 5. GCS motor subscore is 6.     Comments: Mental Status:  Alert, oriented, thought content appropriate, able to give a coherent history. Speech fluent without evidence of aphasia. Able to follow 2 step commands without difficulty.  Cranial Nerves:  II:  Peripheral visual fields grossly normal, pupils equal, round, reactive to light III,IV, VI: ptosis not present, extra-ocular motions intact bilaterally  V,VII: At rest facial asymmetry, with flattening of the left nasolabial fold, but this disappears when he smiles,  VIII: hearing grossly normal to voice  XII: midline tongue extension without fassiculations Motor:  Normal tone.  5/5 strength in all extremities. Sensory: Sensation intact to light touch in all extremities.   Psychiatric: Normal judgment and insight. Alert and oriented x 3. Normal mood.   Labs on Admission: I have personally reviewed following labs and imaging studies  CBC: Recent Labs  Lab 05/28/21 1724 05/28/21 1732  WBC 7.6  --   NEUTROABS 4.9  --   HGB 15.9 16.0  HCT 44.6 47.0  MCV 93.3  --   PLT 187  --    Basic Metabolic Panel: Recent Labs  Lab 05/28/21 1724 05/28/21 1732  NA 134* 135  K 3.7 3.7  CL 99 100  CO2 23  --   GLUCOSE 381* 379*  BUN 17 16  CREATININE 0.87 0.70  CALCIUM 8.5*  --    GFR: CrCl cannot be calculated (Unknown ideal weight.). Liver Function Tests: Recent Labs  Lab 05/28/21 1724   AST 20  ALT 19  ALKPHOS 78  BILITOT 1.0  PROT 6.7  ALBUMIN 3.9   Coagulation Profile: Recent Labs  Lab 05/28/21 1724  INR 1.0   CBG: Recent Labs  Lab 05/28/21 1731  GLUCAP 342*   Radiological Exams on Admission: CT HEAD CODE STROKE WO CONTRAST  Result Date: 05/28/2021 CLINICAL DATA:  Code stroke. EXAM: CT HEAD WITHOUT CONTRAST TECHNIQUE: Contiguous axial images were obtained from the base of the skull through the vertex without intravenous contrast. RADIATION DOSE REDUCTION: This exam was performed according to the departmental dose-optimization program which includes automated exposure control, adjustment of the mA and/or kV according to patient size and/or use of iterative reconstruction technique. COMPARISON:  No  prior CT is available for comparison, correlation is made with 08/04/2007 MRI. FINDINGS: Brain: No evidence of acute infarction, hemorrhage, cerebral edema, mass, mass effect, or midline shift. Ventricles and sulci are normal for age. No extra-axial fluid collection. Periventricular white matter changes, likely the sequela of chronic small vessel ischemic disease. Vascular: No hyperdense vessel or unexpected calcification. Skull: Normal. Negative for fracture or focal lesion. Sinuses/Orbits: No acute finding. Other: The mastoid air cells are well aerated. ASPECTS [facility] Stroke Program Early CT Score) - Ganglionic level infarction (caudate, lentiform nuclei, internal capsule, insula, M1-M3 cortex): 7 - Supraganglionic infarction (M4-M6 cortex): 3 Total score (0-10 with 10 being normal): 10 IMPRESSION: 1. No acute infarct or intracranial hemorrhage. No acute intracranial process. 2. ASPECTS is 10 Code stroke imaging results were communicated on 05/28/2021 at 5:29 pm to provider Dr. Rogene Houston Via telephone, who verbally acknowledged these results. Electronically Signed   By: Merilyn Baba M.D.   On: 05/28/2021 17:30    EKG: Independently reviewed.  Sinus rhythm, rate 99, QTc  447.  Assessment/Plan Principal Problem:   Left-sided weakness Active Problems:   Diabetes ([place])   Essential hypertension   Left-sided weakness-slurred speech, left facial droop, disorientation.  Most of which have resolved.  Left facial droop present at rest only, ? ? 2/2 poor dentition. No TPA, 2/2 low NIHSS. Head Ct without acute abnormality -Telemetry neurologist consulted, recommended stroke work-up - MRI Brain done in ED, awaiting read -MRA head and neck  -Aspirin 325 mg and 300 mg of loading dose Plavix given.  Continue aspirin and Plavix  -Passed bedside stroke swallow screen -PT-speech therapy evaluation -Lipid panel, hemoglobin A1c in a.m. -Echocardiogram -Permissive hypertension - PRN labetalol for systolic > 161  Diabetes mellitus with hyperglycemia-random glucose 381. - HgbA1c -Hold home metformin, glipizide - SSI- M  Hypertension-130s to 140s. -Hold lisinopril HCTZ, allow for permissive hypertension  DVT prophylaxis: Lovenox Code Status: Full code Family Communication: None at bedside.  Patient lives with his daughter Calvin Mills, he has asked that I do not update her yet. Disposition Plan: ~ 1 -2 days Consults called: None Admission status: Obs tele  Bethena Roys MD [facility]  05/28/2021, 7:29 PM

## 2021-05-28 NOTE — ED Notes (Signed)
Pt gave permission to talk to daughter-in-law on phone and give updates.

## 2021-05-28 NOTE — ED Notes (Signed)
Pt remains in MRI 

## 2021-05-28 NOTE — Consult Note (Signed)
[facility] TELEMEDICINE  CONSULT   Date of service: May 28, 2021 Patient Name: Calvin Mills MRN:  [id number] DOB:  1940/07/19 Requesting Provider: Dr. Rogene Houston Location of the provider: [facility] Location of the patient: [facility] Reason for consult: "slurred speech and left sided weakness"  This consult was provided via telemedicine with 2-way video and audio communication. The patient/family was informed that care would be provided in this way and agreed to receive care in this manner.   History of Present Illness  Calvin Mills is a 81 y.o. male with PMH significant for  has a past medical history of Diabetes ([place]), High cholesterol, Hypertension, and Kidney stone. who presents with  left sided weakness and slurred speech. He was at a [employer] alone and restaurant staff noted him to be weak and unable to walk. EMS reported BP 159/104, left sided facial droop, altered speech, and leg weakness.  Stroke Measures   Last Known Well: 3716 TPA Given: Low NIHSS IR Thrombectomy: Exam not suggestive of LVO mRS: Modified Rankin Scale: 0-Completely asymptomatic and back to baseline post- stroke Time of teleneurologist evaluation: 1723  Vitals   Vitals:   05/28/21 1736 05/28/21 1741 05/28/21 1742  BP: (!) 144/83    Pulse: 98    Resp: 12    Temp: 98.4 F (36.9 C) 98.4 F (36.9 C)   TempSrc: Oral Oral   SpO2: 96%    Height:   6' (1.829 m)     Body mass index is 24.41 kg/m.  Tele-neuro Exam and NIHSS   General: NAD, resting comfortably.  LOC Responsiveness 0 LOC Questions 1 LOC Commands 0 Horizontal eye movement 0 Visual field 0 Facial palsy 1 Motor arm - Right arm 0 Motor arm - Left arm 0 Motor leg - Right leg 0 Motor leg - Left leg 0 Limb ataxia 0 Sensory test 0 Language 0 Speech 0 Extinction and inattention 0 NIHSS 2  Other exam findings:  Poor dentition with missing teeth in the front mandible. left lower face droop which becomes symmetric with  smile.  Imaging and Labs   Basic Metabolic Panel:  Lab Results  Component Value Date   NA 135 05/28/2021   K 3.7 05/28/2021   GLUCOSE 379 (H) 05/28/2021   BUN 16 05/28/2021   CREATININE 0.70 05/28/2021   NCT head reviewed which showed no acute infarct or intracranial hemorrhage. No acute intracranial process. ASPECTS is 10  Impression   Calvin Mills is a 81 y.o. male with PMH significant for DM2, HLD, HTN. His limited tele-neurologic examination is notable for Poor dentition with missing teeth in the front mandible, left lower face droop which becomes symmetric with smiling. Labs showed hyperglycemia and NIHSS 2 (face and missed 1 LOC question) with nondisabling symptoms and therefore not a candidate for thrombolytics. He will need admission for stroke evaluation.  Recommendations   STAT aspirin 324mg  and clopidogrel 300mg  loading dose.  - MRI brain without contrast. - Recommend vascular imaging with MRA head and neck. - Recommend TTE. - Recommend labs: HbA1c, lipid panel, TSH. - Recommend Statin if LDL > 70 - Continue aspirin 81mg  daily. - Continue clopidogrel 75mg  daily for 3 weeks. - Permissive hypertension first 24 h < 220/110 (currently 144/83 without treatment).  - Telemetry monitoring for arrhythmia. - Recommend bedside Swallow screen. - Recommend Stroke education. - Recommend PT/OT/SLP consult.  Electronically signed by:  Lynnae Sandhoff, MD Page: [phone] 05/28/2021, 6:01 PM  This patient is receiving care for  possible acute neurological changes. There was 82 minutes of care by this provider at the time of service, including time for direct evaluation via telemedicine, review of medical records, imaging studies and discussion of findings with providers, the patient and/or family.  This patient is critically ill and at significant risk of neurological worsening, death and care requires constant monitoring of vital signs, hemodynamics,respiratory and cardiac  monitoring, neurological assessment, discussion with family, other specialists and medical decision making of high complexity. I spent 82 minutes of neurocritical care time  in the care of  this patient. This was time spent independent of any time provided by nurse practitioner or PA.

## 2021-05-29 ENCOUNTER — Observation Stay (HOSPITAL_BASED_OUTPATIENT_CLINIC_OR_DEPARTMENT_OTHER): Payer: Medicare HMO

## 2021-05-29 ENCOUNTER — Observation Stay (HOSPITAL_COMMUNITY): Payer: Medicare HMO

## 2021-05-29 DIAGNOSIS — Z87891 Personal history of nicotine dependence: Secondary | ICD-10-CM | POA: Diagnosis not present

## 2021-05-29 DIAGNOSIS — Z79899 Other long term (current) drug therapy: Secondary | ICD-10-CM | POA: Diagnosis not present

## 2021-05-29 DIAGNOSIS — Z7984 Long term (current) use of oral hypoglycemic drugs: Secondary | ICD-10-CM | POA: Diagnosis not present

## 2021-05-29 DIAGNOSIS — I10 Essential (primary) hypertension: Secondary | ICD-10-CM | POA: Diagnosis not present

## 2021-05-29 DIAGNOSIS — I639 Cerebral infarction, unspecified: Secondary | ICD-10-CM | POA: Diagnosis not present

## 2021-05-29 DIAGNOSIS — I6601 Occlusion and stenosis of right middle cerebral artery: Secondary | ICD-10-CM | POA: Diagnosis not present

## 2021-05-29 DIAGNOSIS — E119 Type 2 diabetes mellitus without complications: Secondary | ICD-10-CM | POA: Diagnosis not present

## 2021-05-29 DIAGNOSIS — Z20822 Contact with and (suspected) exposure to covid-19: Secondary | ICD-10-CM | POA: Diagnosis not present

## 2021-05-29 DIAGNOSIS — R531 Weakness: Secondary | ICD-10-CM | POA: Diagnosis not present

## 2021-05-29 LAB — LIPID PANEL
Cholesterol: 100 mg/dL (ref 0–200)
HDL: 22 mg/dL — ABNORMAL LOW (ref >40)
LDL Cholesterol: 56 mg/dL (ref 0–99)
Total CHOL/HDL Ratio: 4.5 RATIO
Triglycerides: 112 mg/dL (ref <150)
VLDL: 22 mg/dL (ref 0–40)

## 2021-05-29 LAB — ECHOCARDIOGRAM COMPLETE
AR max vel: 2.72 cm2
AV Area VTI: 3.15 cm2
AV Area mean vel: 2.88 cm2
AV Mean grad: 3 mmHg
AV Peak grad: 5.5 mmHg
Ao pk vel: 1.17 m/s
Area-P 1/2: 2.73 cm2
Calc EF: 44.8 %
Height: 72 in
MV VTI: 2.7 cm2
S' Lateral: 3 cm
Single Plane A2C EF: 41.8 %
Single Plane A4C EF: 44.4 %
Weight: 2600 oz

## 2021-05-29 LAB — HEMOGLOBIN A1C
Hgb A1c MFr Bld: 12.5 % — ABNORMAL HIGH (ref 4.8–5.6)
Mean Plasma Glucose: 312.05 mg/dL

## 2021-05-29 LAB — GLUCOSE, CAPILLARY
Glucose-Capillary: 284 mg/dL — ABNORMAL HIGH (ref 70–99)
Glucose-Capillary: 285 mg/dL — ABNORMAL HIGH (ref 70–99)
Glucose-Capillary: 336 mg/dL — ABNORMAL HIGH (ref 70–99)

## 2021-05-29 MED ORDER — ACETAMINOPHEN 325 MG PO TABS
650.0000 mg | ORAL_TABLET | ORAL | 0 refills | Status: AC | PRN
Start: 2021-05-29 — End: ?

## 2021-05-29 MED ORDER — GLIPIZIDE 10 MG PO TABS: 10.0000 mg | ORAL_TABLET | Freq: Two times a day (BID) | ORAL | 4 refills | Status: DC

## 2021-05-29 MED ORDER — CLOPIDOGREL BISULFATE 75 MG PO TABS: 75.0000 mg | ORAL_TABLET | Freq: Every day | ORAL | 0 refills | Status: DC

## 2021-05-29 MED ORDER — ASPIRIN 81 MG PO TBEC: 81.0000 mg | DELAYED_RELEASE_TABLET | Freq: Every day | ORAL | 11 refills | Status: DC

## 2021-05-29 MED ORDER — GADOBUTROL 1 MMOL/ML IV SOLN
7.0000 mL | Freq: Once | INTRAVENOUS | Status: AC | PRN
  Administered 2021-05-29: 7 mL via INTRAVENOUS

## 2021-05-29 MED ORDER — METFORMIN HCL 1000 MG PO TABS: 1000.0000 mg | ORAL_TABLET | Freq: Two times a day (BID) | ORAL | 4 refills | Status: AC

## 2021-05-29 NOTE — TOC Transition Note (Signed)
Transition of Care [facility]) - CM/SW Discharge Note   Patient Details  Name: Calvin Mills MRN: [id number] Date of Birth: Jan 03, 1941  Transition of Care [facility]) CM/SW Contact:  Shade Flood, LCSW Phone Number: 05/29/2021, 4:15 PM   Clinical Narrative:     Pt with orders for dc home today. MD has ordered DME for pt. Spoke with pt and his son to review CMS provider and delivery options. Order placed with [employer] and DME will be delivered to pt's home. There are no other TOC needs for dc.  Final next level of care: Home/Self Care Barriers to Discharge: Barriers Resolved   Patient Goals and CMS Choice Patient states their goals for this hospitalization and ongoing recovery are:: go home CMS Medicare.gov Compare Post Acute Care list provided to:: Patient Represenative (must comment) Choice offered to / list presented to : Adult Children  Discharge Placement                       Discharge Plan and Services                DME Arranged: 3-N-1, Walker rolling with seat DME Agency: [facility] Date DME Agency Contacted: 05/29/21   Representative spoke with at DME Agency: [place] Determinants of Health ([place]) Interventions     Readmission Risk Interventions No flowsheet data found.

## 2021-05-29 NOTE — Discharge Instructions (Signed)
1)Please take Aspirin 81 mg daily along with Plavix 75 mg daily for 21 days then after that STOP the Plavix  and continue ONLY Aspirin 81 mg daily indefinitely--for stroke Prevention   2)Avoid ibuprofen/Advil/Aleve/Motrin/Goody Powders/Naproxen/BC powders/Meloxicam/Diclofenac/Indomethacin and other Nonsteroidal anti-inflammatory medications as these will make you more likely to bleed and can cause stomach ulcers, can also cause Kidney problems.   3)follow up with [employer], MD-to discuss possibly starting insulin injections Arceo diabetes is way way out of control

## 2021-05-29 NOTE — Progress Notes (Signed)
*  PRELIMINARY RESULTS* Echocardiogram 2D Echocardiogram has been performed.  Calvin Mills 05/29/2021, 1:45 PM

## 2021-05-29 NOTE — Progress Notes (Signed)
SLP Cancellation Note  Patient Details Name: Calvin Mills MRN: [id number] DOB: 1940-09-08   Cancelled treatment:       Reason Eval/Treat Not Completed: SLP screened, no needs identified, will sign off; SLP screened Pt in room. Pt denies any changes in swallowing, speech, language, or cognition. MRI negative for acute changes. SLE will be deferred at this time. Reconsult if indicated. SLP will sign off.   SLP screened Pt in room. Pt denies any changes in swallowing, speech, language, or cognition. MRI negative for acute changes. SLE will be deferred at this time. Reconsult if indicated. SLP will sign off.    Halie Gass 05/29/2021, 2:50 PM

## 2021-05-29 NOTE — Evaluation (Signed)
Physical Therapy Evaluation Patient Details Name: Calvin Mills MRN: [id number] DOB: May 24, 1940 Today's Date: 05/29/2021  History of Present Illness  Calvin Mills is a 81 y.o. male with medical history significant for diabetes mellitus, hypertension.  Patient was brought to the ED via EMS reports of strokelike symptoms.  Patient was last known normal 1650.  Patient was at [employer], he had just eaten by himself, he went to the restroom came back and was walking out when one of the staff at the restaurant saw him walking, and told him he appeared unsteady on his feet, patient asked if he could go outside and sit down on one of the chairs.  He did, the staff called EMS.   On EMS arrival it was thought that patient's speech was abnormal, with a left facial droop and left-sided weakness and was disoriented  On my evaluation patient is awake alert and oriented to person place and situation.  He confirms the above history by telling me, he tells me he did not feel anything was abnormal.  He does not remember feeling confused, he remembers the details of today.  He lives with his daughter Calvin Mills.  No history of seizures.  He takes aspirin daily.   Clinical Impression  Patient functioning at baseline for functional mobility and gait demonstrating good return for transfers, ambulation in room/hallway without loss of balance.  Plan:  Patient discharged from physical therapy to care of nursing for ambulation daily as tolerated for length of stay.         Recommendations for follow up therapy are one component of a multi-disciplinary discharge planning process, led by the attending physician.  Recommendations may be updated based on patient status, additional functional criteria and insurance authorization.  Follow Up Recommendations No PT follow up    Assistance Recommended at Discharge PRN  Patient can return home with the following  Help with stairs or ramp for entrance    Equipment  Recommendations None recommended by PT  Recommendations for Other Services       Functional Status Assessment Patient has not had a recent decline in their functional status     Precautions / Restrictions Precautions Precautions: None Restrictions Weight Bearing Restrictions: No      Mobility  Bed Mobility Overal bed mobility: Modified Independent                  Transfers Overall transfer level: Modified independent                      Ambulation/Gait Ambulation/Gait assistance: Modified independent (Device/Increase time) Gait Distance (Feet): 100 Feet Assistive device: None Gait Pattern/deviations: Decreased step length - left;Decreased stance time - right;Decreased stride length Gait velocity: decreased     General Gait Details: slightly labored cadence with good return for ambulation in room and hallway without loss of balance  Stairs            Wheelchair Mobility    Modified Rankin (Stroke Patients Only)       Balance Overall balance assessment: Mild deficits observed, not formally tested                                           Pertinent Vitals/Pain Pain Assessment: No/denies pain    Home Living Family/patient expects to be discharged to:: Private residence Living Arrangements: Alone Available Help at  Discharge: Family;Available PRN/intermittently Type of Home: House Home Access: Stairs to enter Entrance Stairs-Rails: None Entrance Stairs-Number of Steps: 1   Home Layout: One level Home Equipment: Cane - single point;Grab bars - tub/shower Additional Comments: family checks on him at least every other day per patient's son    Prior Function Prior Level of Function : Independent/Modified Independent             Mobility Comments: household and short distanced community ambulator without AD, drives occasionally ADLs Comments: assisted for community ADLs by family     Hand Dominance         Extremity/Trunk Assessment   Upper Extremity Assessment Upper Extremity Assessment: Overall WFL for tasks assessed    Lower Extremity Assessment Lower Extremity Assessment: Overall WFL for tasks assessed    Cervical / Trunk Assessment Cervical / Trunk Assessment: Normal  Communication   Communication: No difficulties  Cognition Arousal/Alertness: Awake/alert Behavior During Therapy: WFL for tasks assessed/performed Overall Cognitive Status: Within Functional Limits for tasks assessed                                          General Comments      Exercises     Assessment/Plan    PT Assessment Patient does not need any further PT services  PT Problem List         PT Treatment Interventions      PT Goals (Current goals can be found in the Care Plan section)  Acute Rehab PT Goals Patient Stated Goal: return home with family to assist PT Goal Formulation: With patient/family Time For Goal Achievement: 05/29/21 Potential to Achieve Goals: Good    Frequency       Co-evaluation               AM-PAC PT "6 Clicks" Mobility  Outcome Measure Help needed turning from your back to your side while in a flat bed without using bedrails?: None Help needed moving from lying on your back to sitting on the side of a flat bed without using bedrails?: None Help needed moving to and from a bed to a chair (including a wheelchair)?: None Help needed standing up from a chair using your arms (e.g., wheelchair or bedside chair)?: None Help needed to walk in hospital room?: None Help needed climbing 3-5 steps with a railing? : A Little 6 Click Score: 23    End of Session   Activity Tolerance: Patient tolerated treatment well Patient left: with call bell/phone within reach Nurse Communication: Mobility status PT Visit Diagnosis: Unsteadiness on feet (R26.81);Other abnormalities of gait and mobility (R26.89);Muscle weakness (generalized) (M62.81)    Time:  2683-4196 PT Time Calculation (min) (ACUTE ONLY): 20 min   Charges:   PT Evaluation $PT Eval Moderate Complexity: 1 Mod PT Treatments $Therapeutic Activity: 8-22 mins        11:20 AM, 05/29/21 Lonell Grandchild, MPT Physical Therapist with [facility] [place] 336 [phone] office [phone] mobile phone

## 2021-05-29 NOTE — TOC Progression Note (Signed)
°  Transition of Care [facility]) Screening Note   Patient Details  Name: Calvin Mills Date of Birth: 1940-09-05   Transition of Care [facility]) CM/SW Contact:    Shade Flood, LCSW Phone Number: 05/29/2021, 10:45 AM    Transition of Care Department [facility]) has reviewed patient and no TOC needs have been identified at this time. We will continue to monitor patient advancement through interdisciplinary progression rounds. If new patient transition needs arise, please place a TOC consult.

## 2021-05-29 NOTE — Discharge Summary (Signed)
Calvin Mills, is a 81 y.o. male  DOB Jul 01, 1940  MRN [id number].  Admission date:  05/28/2021  Admitting Physician  Bethena Roys, MD  Discharge Date:  05/29/2021   Primary MD  Redmond School, MD  Recommendations for primary care physician for things to follow:   1)Please take Aspirin 81 mg daily along with Plavix 75 mg daily for 21 days then after that STOP the Plavix  and continue ONLY Aspirin 81 mg daily indefinitely--for stroke Prevention   2)Avoid ibuprofen/Advil/Aleve/Motrin/Goody Powders/Naproxen/BC powders/Meloxicam/Diclofenac/Indomethacin and other Nonsteroidal anti-inflammatory medications as these will make you more likely to bleed and can cause stomach ulcers, can also cause Kidney problems.   3)follow up with Redmond School, MD-to discuss possibly starting insulin injections As your  diabetes is way way out of control   Admission Diagnosis  Left-sided weakness [R53.1] Altered mental status, unspecified altered mental status type [R41.82] Cerebrovascular accident (CVA), unspecified mechanism ([place]) [I63.9]   Discharge Diagnosis  Left-sided weakness [R53.1] Altered mental status, unspecified altered mental status type [R41.82] Cerebrovascular accident (CVA), unspecified mechanism ([place]) [I63.9]    Principal Problem:   Left-sided weakness Active Problems:   Diabetes ([place])   Essential hypertension      Past Medical History:  Diagnosis Date   Diabetes ([place])    High cholesterol    Hypertension    Kidney stone     Past Surgical History:  Procedure Laterality Date   COLONOSCOPY N/A 05/02/2014   Procedure: COLONOSCOPY;  Surgeon: Rogene Houston, MD;  [facility];  Service: Endoscopy;  Laterality: N/A;  145 - moved to 9:30 - Ann notified pt   COLONOSCOPY N/A 05/14/2015   Procedure: COLONOSCOPY;  Surgeon: Rogene Houston, MD;  Location: [facility];  Service:  Endoscopy;  Laterality: N/A;  9:30   EYE SURGERY     15 yrs ago: torn retina   TONSILLECTOMY       HPI  from the history and physical done on the day of admission:    Chief Complaint: Left sided weakness   HPI: Calvin Mills is a 81 y.o. male with medical history significant for diabetes mellitus, hypertension. Patient was brought to the ED via EMS reports of strokelike symptoms.  Patient was last known normal 1650. Patient was at [employer], he had just eaten by himself, he went to the restroom came back and was walking out when one of the staff at the restaurant saw him walking, and told him he appeared unsteady on his feet, patient asked if he could go outside and sit down on one of the chairs.  He did, the staff called EMS.  On EMS arrival it was thought that patient's speech was abnormal, with a left facial droop and left-sided weakness and was disoriented On my evaluation patient is awake alert and oriented to person place and situation.  He confirms the above history by telling me, he tells me he did not feel anything was abnormal.  He does not remember feeling confused, he  remembers the details of today.  He lives with his daughter Calvin Mills. No history of seizures.  He takes aspirin daily.   ED Course: Stable vitals.  Blood pressure 1 34-1 44.  EKG shows sinus rhythm.  Head CT without acute abnormality.  Tele-neurology was consulted, no tPA, NIHSS 2, nondisabling symptoms definitely candidate for thrombolytics.  Admission requested.  For full stroke work-up.       Hospital Course:    Left-sided weakness-she was admitted with concerns for slurred speech, left facial droop, disorientation.  --Telemetry neurologist consulted, recommended stroke work-up -Neuro symptoms have resolved  -CT head, MRI brain, MRA head and MRA neck without acute stroke or other  acute findings specifically No LVO -Treated with aspirin and Plavix -Echo with EF of 50 to 34%, grade 1 diastolic  dysfunction noted -LDL is 56, HDL is 22 -A1c 12.5 -Discharged on aspirin 81 mg daily along with Plavix 75 mg daily for 21 days then after that STOP the Plavix  and continue ONLY Aspirin 81 mg daily indefinitely--for secondary stroke Prevention (Per The multicenter SAMMPRIS trial) For long-term stroke prevention (beyond the 21- or 90-day duration of DAPT), we recommend treatment with aspirin. Clopidogrel monotherapy or the combination drug aspirin-extended-release dipyridamole are reasonable alternatives to aspirin but have not been specifically studied in ICAS.   - Diabetes mellitus with hyperglycemia- -A1c 12.5--sliding uncontrolled diabetes with hyperglycemia PTA -Okay to resume metformin and glipizide -Anticipate patient will need insulin therapy -Follow-up with PCP to discuss further diabetic management advised  Hypertension-130s to 140s. -c/n lisinopril HCTZ,   Discharge Condition: stable, neuro symptoms have resolved  Follow UP   Follow-up Information     Redmond School, MD Follow up in 1 week(s).   Specialty: Internal Medicine Why: recheck Contact information: [address] [place] [address] [phone]                 Diet and Activity recommendation:  As advised  Discharge Instructions    Discharge Instructions     Call MD for:  difficulty breathing, headache or visual disturbances   Complete by: As directed    Call MD for:  persistant dizziness or light-headedness   Complete by: As directed    Call MD for:  persistant nausea and vomiting   Complete by: As directed    Call MD for:  temperature >100.4   Complete by: As directed    Diet - low sodium heart healthy   Complete by: As directed    Diet Carb Modified   Complete by: As directed    Discharge instructions   Complete by: As directed    1)Please take Aspirin 81 mg daily along with Plavix 75 mg daily for 21 days then after that STOP the Plavix  and continue ONLY Aspirin 81 mg daily  indefinitely--for stroke Prevention   2)Avoid ibuprofen/Advil/Aleve/Motrin/Goody Powders/Naproxen/BC powders/Meloxicam/Diclofenac/Indomethacin and other Nonsteroidal anti-inflammatory medications as these will make you more likely to bleed and can cause stomach ulcers, can also cause Kidney problems.   3)follow up with Redmond School, MD-to discuss possibly starting insulin injections [place] diabetes is way way out of control   Increase activity slowly   Complete by: As directed          Discharge Medications     Allergies as of 05/29/2021   No Known Allergies      Medication List     TAKE these medications    acetaminophen 325 MG tablet Commonly known as: TYLENOL Take 2 tablets (650 mg  total) by mouth every 4 (four) hours as needed for mild pain (or temp > 37.5 C (99.5 F)).   aspirin 81 MG EC tablet Take 1 tablet (81 mg total) by mouth daily with breakfast. Please take Aspirin 81 mg daily along with Plavix 75 mg daily for 21 days then after that STOP the Plavix  and continue ONLY Aspirin 81 mg daily indefinitely--for  stroke Prevention   clopidogrel 75 MG tablet Commonly known as: PLAVIX Take 1 tablet (75 mg total) by mouth daily. Please take Aspirin 81 mg daily along with Plavix 75 mg daily for 21 days then after that STOP the Plavix  and continue ONLY Aspirin 81 mg daily indefinitely--for  stroke Prevention Start taking on: May 30, 2021   glipiZIDE 10 MG tablet Commonly known as: GLUCOTROL Take 1 tablet (10 mg total) by mouth 2 (two) times daily before a meal.   lisinopril-hydrochlorothiazide 20-25 MG tablet Commonly known as: ZESTORETIC Take 1 tablet by mouth daily.   metFORMIN 1000 MG tablet Commonly known as: GLUCOPHAGE Take 1 tablet (1,000 mg total) by mouth 2 (two) times daily with a meal. What changed: when to take this        Major procedures and Radiology Reports - PLEASE review detailed and final reports for all details, in brief -   MR ANGIO  HEAD WO CONTRAST  Result Date: 05/29/2021 CLINICAL DATA:  Left-sided weakness, slurred speech, left facial droop, mostly resolved. Stroke-like symptoms EXAM: MRA NECK WITHOUT AND WITH CONTRAST MRA HEAD WITHOUT CONTRAST TECHNIQUE: Multiplanar and multiecho pulse sequences of the neck were obtained without and with intravenous contrast. Angiographic images of the neck were obtained using MRA technique without and with intravenous contrast; Angiographic images of the Circle of Willis were obtained using MRA technique without intravenous contrast. CONTRAST:  81mL GADAVIST GADOBUTROL 1 MMOL/ML IV SOLN COMPARISON:  Brain MRI 05/28/2021 common carotid Doppler 11/26/2006 FINDINGS: MRA HEAD FINDINGS Anterior circulation: There is irregularity of the bilateral cavernous ICAs likely reflecting atherosclerotic disease resulting in mild-to-moderate stenosis of the right cavernous and ophthalmic segments and no greater than mild stenosis on the left. The bilateral M1 segments are patent. There is moderate to severe stenosis of a proximal right superior M2 division (2-122). Otherwise, there is mild atherosclerotic irregularity of the distal MCA branches bilaterally without other high-grade stenosis or occlusion. The bilateral ACAs are patent. There is no aneurysm. Posterior circulation: The bilateral v4 segments are patent. PICA is identified bilaterally. The basilar artery is patent. The bilateral PCAs are patent proximally. There is diminished flow related enhancement in the distal right PCA likely reflecting atherosclerotic disease. The posterior communicating arteries are not identified. There is no aneurysm. Anatomic variants: None. MRA NECK FINDINGS Aortic arch: The imaged aortic arch is unremarkable. The origins of the major branch vessels appear patent. The subclavian arteries are patent. Right carotid system: There is mild irregularity at the right carotid bulb likely reflecting atherosclerotic disease without  hemodynamically significant stenosis or occlusion. There is no evidence of dissection or aneurysm. Left carotid system: The left common, internal, and external carotid arteries are patent, without hemodynamically significant stenosis or occlusion. There is no evidence of dissection or aneurysm. Vertebral arteries: The vertebral arteries are patent with antegrade flow. There is no evidence of hemodynamically significant stenosis or occlusion. There is no evidence of dissection or aneurysm. Other: None IMPRESSION: 1. Diminished flow related enhancement in the distal right PCA branches likely reflecting atherosclerotic disease. 2. 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 No other high-grade stenosis or occlusion in the intracranial vasculature. 3. Mild irregularity of the right carotid bulb without hemodynamically significant stenosis or occlusion. Patent left carotid system and vertebral arteries. Electronically Signed   By: Valetta Mole M.D.   On: 05/29/2021 13:51   MR ANGIO NECK W WO CONTRAST  Result Date: 05/29/2021 CLINICAL DATA:  Left-sided weakness, slurred speech, left facial droop, mostly resolved. Stroke-like symptoms EXAM: MRA NECK WITHOUT AND WITH CONTRAST MRA HEAD WITHOUT CONTRAST TECHNIQUE: Multiplanar and multiecho pulse sequences of the neck were obtained without and with intravenous contrast. Angiographic images of the neck were obtained using MRA technique without and with intravenous contrast; Angiographic images of the Circle of Willis were obtained using MRA technique without intravenous contrast. CONTRAST:  77mL GADAVIST GADOBUTROL 1 MMOL/ML IV SOLN COMPARISON:  Brain MRI 05/28/2021 common carotid Doppler 11/26/2006 FINDINGS: MRA HEAD FINDINGS Anterior circulation: There is irregularity of the bilateral cavernous ICAs likely reflecting  atherosclerotic disease resulting in mild-to-moderate stenosis of the right cavernous and ophthalmic segments and no greater than mild stenosis on the left. The bilateral M1 segments are patent. There is moderate to severe stenosis of a proximal right superior M2 division (2-122). Otherwise, there is mild atherosclerotic irregularity of the distal MCA branches bilaterally without other high-grade stenosis or occlusion. The bilateral ACAs are patent. There is no aneurysm. Posterior circulation: The bilateral v4 segments are patent. PICA is identified bilaterally. The basilar artery is patent. The bilateral PCAs are patent proximally. There is diminished flow related enhancement in the distal right PCA likely reflecting atherosclerotic disease. The posterior communicating arteries are not identified. There is no aneurysm. Anatomic variants: None. MRA NECK FINDINGS Aortic arch: The imaged aortic arch is unremarkable. The origins of the major branch vessels appear patent. The subclavian arteries are patent. Right carotid system: There is mild irregularity at the right carotid bulb likely reflecting atherosclerotic disease without hemodynamically significant stenosis or occlusion. There is no evidence of dissection or aneurysm. Left carotid system: The left common, internal, and external carotid arteries are patent, without hemodynamically significant stenosis or occlusion. There is no evidence of dissection or aneurysm. Vertebral arteries: The vertebral arteries are patent with antegrade flow. There is no evidence of hemodynamically significant stenosis or occlusion. There is no evidence of dissection or aneurysm. Other: None IMPRESSION: 1. Diminished flow related enhancement in the distal right PCA branches likely reflecting atherosclerotic disease. 2. 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 No other high-grade stenosis or occlusion in the intracranial vasculature. 3. Mild irregularity of the right carotid bulb without hemodynamically significant stenosis or occlusion. Patent left carotid system and vertebral arteries. Electronically Signed   By: Valetta Mole M.D.   On: 05/29/2021 13:51   MR Brain Wo Contrast (neuro protocol)  Result Date: 05/28/2021 CLINICAL DATA:  Initial evaluation for acute neuro deficit.  Stroke. EXAM: MRI HEAD WITHOUT CONTRAST TECHNIQUE: Multiplanar, multiecho pulse sequences of the brain and surrounding structures were obtained without intravenous contrast. COMPARISON:  Head CT from earlier the same day. FINDINGS: Brain: Generalized age-related cerebral atrophy. Patchy and confluent T2/FLAIR hyperintensity involving the periventricular deep white matter both cerebral hemispheres, most consistent with chronic small vessel ischemic disease, mild-to-moderate in nature. Remote lacunar infarct present at the left thalamus.  No abnormal foci of restricted diffusion to suggest acute or subacute ischemia. Gray-white matter differentiation maintained. No chronic cortical infarction. No acute or chronic intracranial hemorrhage. No mass lesion, midline shift or mass effect no hydrocephalus or extra-axial fluid collection. Pituitary gland suprasellar region normal. Midline structures intact. Vascular: Major intracranial vascular flow voids are maintained. Skull and upper cervical spine: Craniocervical junction within normal limits. Bone marrow signal intensity normal. No scalp soft tissue abnormality. Sinuses/Orbits: Globes orbital soft tissues demonstrate no acute finding. Postsurgical changes noted at the left globe. Scattered mucosal thickening noted throughout the sphenoid ethmoidal and maxillary sinuses. No air-fluid levels to suggest acute sinusitis. Trace right mastoid effusion, of doubtful significance. Other: None. IMPRESSION: 1. No acute  intracranial abnormality. 2. Generalized age-related cerebral atrophy with mild-to-moderate chronic small vessel ischemic disease. Electronically Signed   By: Jeannine Boga M.D.   On: 05/28/2021 20:16   CT HEAD CODE STROKE WO CONTRAST  Result Date: 05/28/2021 CLINICAL DATA:  Code stroke. EXAM: CT HEAD WITHOUT CONTRAST TECHNIQUE: Contiguous axial images were obtained from the base of the skull through the vertex without intravenous contrast. RADIATION DOSE REDUCTION: This exam was performed according to the departmental dose-optimization program which includes automated exposure control, adjustment of the mA and/or kV according to patient size and/or use of iterative reconstruction technique. COMPARISON:  No prior CT is available for comparison, correlation is made with 08/04/2007 MRI. FINDINGS: Brain: No evidence of acute infarction, hemorrhage, cerebral edema, mass, mass effect, or midline shift. Ventricles and sulci are normal for age. No extra-axial fluid collection. Periventricular white matter changes, likely the sequela of chronic small vessel ischemic disease. Vascular: No hyperdense vessel or unexpected calcification. Skull: Normal. Negative for fracture or focal lesion. Sinuses/Orbits: No acute finding. Other: The mastoid air cells are well aerated. ASPECTS [facility] Stroke Program Early CT Score) - Ganglionic level infarction (caudate, lentiform nuclei, internal capsule, insula, M1-M3 cortex): 7 - Supraganglionic infarction (M4-M6 cortex): 3 Total score (0-10 with 10 being normal): 10 IMPRESSION: 1. No acute infarct or intracranial hemorrhage. No acute intracranial process. 2. ASPECTS is 10 Code stroke imaging results were communicated on 05/28/2021 at 5:29 pm to provider Dr. Rogene Houston Via telephone, who verbally acknowledged these results. Electronically Signed   By: Merilyn Baba M.D.   On: 05/28/2021 17:30    Micro Results   Recent Results (from the past 240 hour(s))  Resp Panel by RT-PCR  (Flu A&B, Covid) Nasopharyngeal Swab     Status: None   Collection Time: 05/28/21  5:51 PM   Specimen: Nasopharyngeal Swab; Nasopharyngeal(NP) swabs in vial transport medium  Result Value Ref Range Status   SARS Coronavirus 2 by RT PCR NEGATIVE NEGATIVE Final    Comment: (NOTE) SARS-CoV-2 target nucleic acids are NOT DETECTED.  The SARS-CoV-2 RNA is generally detectable in upper respiratory specimens during the acute phase of infection. The lowest concentration of SARS-CoV-2 viral copies this assay can detect is 138 copies/mL. A negative result does not preclude SARS-Cov-2 infection and should not be used as the sole basis for treatment or other patient management decisions. A negative result may occur with  improper specimen collection/handling, submission of specimen other than nasopharyngeal swab, presence of viral mutation(s) within the areas targeted by this assay, and inadequate number of viral copies(<138 copies/mL). A negative result must be combined with clinical observations, patient history, and epidemiological information. The expected result is Negative.  Fact Sheet for Patients:  EntrepreneurPulse.com.au  Fact Sheet for Healthcare Providers:  IncredibleEmployment.be  This test  is no t yet approved or cleared by the [place] and  has been authorized for detection and/or diagnosis of SARS-CoV-2 by FDA under an Emergency Use Authorization (EUA). This EUA will remain  in effect (meaning this test can be used) for the duration of the COVID-19 declaration under Section 564(b)(1) of the Act, 21 U.S.C.section 360bbb-3(b)(1), unless the authorization is terminated  or revoked sooner.       Influenza A by PCR NEGATIVE NEGATIVE Final   Influenza B by PCR NEGATIVE NEGATIVE Final    Comment: (NOTE) The Xpert Xpress SARS-CoV-2/FLU/RSV plus assay is intended as an aid in the diagnosis of influenza from Nasopharyngeal swab specimens  and should not be used as a sole basis for treatment. Nasal washings and aspirates are unacceptable for Xpert Xpress SARS-CoV-2/FLU/RSV testing.  Fact Sheet for Patients: EntrepreneurPulse.com.au  Fact Sheet for Healthcare Providers: IncredibleEmployment.be  This test is not yet approved or cleared by the [place] FDA and has been authorized for detection and/or diagnosis of SARS-CoV-2 by FDA under an Emergency Use Authorization (EUA). This EUA will remain in effect (meaning this test can be used) for the duration of the COVID-19 declaration under Section 564(b)(1) of the Act, 21 U.S.C. section 360bbb-3(b)(1), unless the authorization is terminated or revoked.  Performed at [facility], [address] [place], [place] [address]        Today   Subjective    Calvin Mills today has no new complaints  -  No Nausea, Vomiting or Diarrhea No fever  Or chills  -No chest pains no palpitations no further neuro concerns at this time         Patient has been seen and examined prior to discharge   Objective   Blood pressure (!) 154/84, pulse 88, temperature (!) 97.4 F (36.3 C), temperature source Oral, resp. rate 19, height 6' (1.829 m), weight 73.7 kg, SpO2 94 %.   Intake/Output Summary (Last 24 hours) at 05/29/2021 1546 Last data filed at 05/29/2021 1215 Gross per 24 hour  Intake --  Output 650 ml  Net -650 ml    Exam Gen:- Awake Alert, no acute distress  HEENT:- [place].AT, No sclera icterus Neck-Supple Neck,No JVD,.  Lungs-  CTAB , good air movement bilaterally  CV- S1, S2 normal, regular Abd-  +ve B.Sounds, Abd Soft, No tenderness,    Extremity/Skin:-   good pulses Psych-affect is appropriate, oriented x3 Neuro-no new focal deficits, no tremors    Data Review   CBC w Diff:  Lab Results  Component Value Date   WBC 7.6 05/28/2021   HGB 16.0 05/28/2021   HCT 47.0 05/28/2021   PLT 187 05/28/2021   LYMPHOPCT 24 05/28/2021    MONOPCT 10 05/28/2021   EOSPCT 1 05/28/2021   BASOPCT 0 05/28/2021    CMP:  Lab Results  Component Value Date   NA 135 05/28/2021   K 3.7 05/28/2021   CL 100 05/28/2021   CO2 23 05/28/2021   BUN 16 05/28/2021   CREATININE 0.70 05/28/2021   PROT 6.7 05/28/2021   ALBUMIN 3.9 05/28/2021   BILITOT 1.0 05/28/2021   ALKPHOS 78 05/28/2021   AST 20 05/28/2021   ALT 19 05/28/2021  .   Total Discharge time is about 33 minutes  Roxan Hockey M.D on 05/29/2021 at 3:46 PM  Go to [email or web address] -  for contact info  [facility] - Office  [phone]

## 2021-05-30 DIAGNOSIS — R531 Weakness: Secondary | ICD-10-CM | POA: Diagnosis not present

## 2021-06-09 DIAGNOSIS — I639 Cerebral infarction, unspecified: Secondary | ICD-10-CM | POA: Diagnosis not present

## 2021-06-09 DIAGNOSIS — E785 Hyperlipidemia, unspecified: Secondary | ICD-10-CM | POA: Diagnosis not present

## 2021-06-09 DIAGNOSIS — E1165 Type 2 diabetes mellitus with hyperglycemia: Secondary | ICD-10-CM | POA: Diagnosis not present

## 2021-06-09 DIAGNOSIS — Z0100 Encounter for examination of eyes and vision without abnormal findings: Secondary | ICD-10-CM | POA: Diagnosis not present

## 2021-06-09 DIAGNOSIS — R531 Weakness: Secondary | ICD-10-CM | POA: Diagnosis not present

## 2021-06-09 DIAGNOSIS — R4182 Altered mental status, unspecified: Secondary | ICD-10-CM | POA: Diagnosis not present

## 2021-06-09 DIAGNOSIS — M1991 Primary osteoarthritis, unspecified site: Secondary | ICD-10-CM | POA: Diagnosis not present

## 2021-06-09 DIAGNOSIS — Z6824 Body mass index (BMI) 24.0-24.9, adult: Secondary | ICD-10-CM | POA: Diagnosis not present

## 2021-06-09 DIAGNOSIS — E663 Overweight: Secondary | ICD-10-CM | POA: Diagnosis not present

## 2021-06-09 DIAGNOSIS — I10 Essential (primary) hypertension: Secondary | ICD-10-CM | POA: Diagnosis not present

## 2021-08-05 DIAGNOSIS — E1165 Type 2 diabetes mellitus with hyperglycemia: Secondary | ICD-10-CM | POA: Diagnosis not present

## 2021-08-05 DIAGNOSIS — I10 Essential (primary) hypertension: Secondary | ICD-10-CM | POA: Diagnosis not present

## 2021-08-05 DIAGNOSIS — M1991 Primary osteoarthritis, unspecified site: Secondary | ICD-10-CM | POA: Diagnosis not present

## 2021-08-05 DIAGNOSIS — E113299 Type 2 diabetes mellitus with mild nonproliferative diabetic retinopathy without macular edema, unspecified eye: Secondary | ICD-10-CM | POA: Diagnosis not present

## 2021-08-05 DIAGNOSIS — I639 Cerebral infarction, unspecified: Secondary | ICD-10-CM | POA: Diagnosis not present

## 2021-08-05 DIAGNOSIS — Z0001 Encounter for general adult medical examination with abnormal findings: Secondary | ICD-10-CM | POA: Diagnosis not present

## 2021-08-05 DIAGNOSIS — Z1331 Encounter for screening for depression: Secondary | ICD-10-CM | POA: Diagnosis not present

## 2021-08-05 DIAGNOSIS — Z6823 Body mass index (BMI) 23.0-23.9, adult: Secondary | ICD-10-CM | POA: Diagnosis not present

## 2021-08-05 DIAGNOSIS — R4182 Altered mental status, unspecified: Secondary | ICD-10-CM | POA: Diagnosis not present

## 2021-08-05 DIAGNOSIS — M79675 Pain in left toe(s): Secondary | ICD-10-CM | POA: Diagnosis not present

## 2021-09-28 ENCOUNTER — Encounter (HOSPITAL_COMMUNITY): Payer: Self-pay

## 2021-09-28 ENCOUNTER — Inpatient Hospital Stay (HOSPITAL_COMMUNITY)
Admission: EM | Admit: 2021-09-28 | Discharge: 2021-10-07 | DRG: 871 | Disposition: A | Discharge status: Skilled Nursing Facility | Payer: Medicare HMO | Attending: Internal Medicine | Admitting: Internal Medicine

## 2021-09-28 ENCOUNTER — Emergency Department (HOSPITAL_COMMUNITY): Payer: Medicare HMO

## 2021-09-28 ENCOUNTER — Other Ambulatory Visit: Payer: Self-pay

## 2021-09-28 DIAGNOSIS — I2699 Other pulmonary embolism without acute cor pulmonale: Secondary | ICD-10-CM | POA: Diagnosis not present

## 2021-09-28 DIAGNOSIS — Z87891 Personal history of nicotine dependence: Secondary | ICD-10-CM

## 2021-09-28 DIAGNOSIS — E7800 Pure hypercholesterolemia, unspecified: Secondary | ICD-10-CM | POA: Diagnosis present

## 2021-09-28 DIAGNOSIS — Z20822 Contact with and (suspected) exposure to covid-19: Secondary | ICD-10-CM | POA: Diagnosis present

## 2021-09-28 DIAGNOSIS — I4891 Unspecified atrial fibrillation: Secondary | ICD-10-CM

## 2021-09-28 DIAGNOSIS — J9601 Acute respiratory failure with hypoxia: Secondary | ICD-10-CM

## 2021-09-28 DIAGNOSIS — R4020 Unspecified coma: Secondary | ICD-10-CM | POA: Diagnosis not present

## 2021-09-28 DIAGNOSIS — E869 Volume depletion, unspecified: Secondary | ICD-10-CM | POA: Diagnosis not present

## 2021-09-28 DIAGNOSIS — Z794 Long term (current) use of insulin: Secondary | ICD-10-CM

## 2021-09-28 DIAGNOSIS — Z7982 Long term (current) use of aspirin: Secondary | ICD-10-CM | POA: Diagnosis not present

## 2021-09-28 DIAGNOSIS — R739 Hyperglycemia, unspecified: Secondary | ICD-10-CM | POA: Diagnosis not present

## 2021-09-28 DIAGNOSIS — Z66 Do not resuscitate: Secondary | ICD-10-CM | POA: Diagnosis not present

## 2021-09-28 DIAGNOSIS — F0392 Unspecified dementia, unspecified severity, with psychotic disturbance: Secondary | ICD-10-CM | POA: Diagnosis present

## 2021-09-28 DIAGNOSIS — R410 Disorientation, unspecified: Secondary | ICD-10-CM

## 2021-09-28 DIAGNOSIS — I2690 Septic pulmonary embolism without acute cor pulmonale: Secondary | ICD-10-CM

## 2021-09-28 DIAGNOSIS — Z7984 Long term (current) use of oral hypoglycemic drugs: Secondary | ICD-10-CM | POA: Diagnosis not present

## 2021-09-28 DIAGNOSIS — G9340 Encephalopathy, unspecified: Secondary | ICD-10-CM

## 2021-09-28 DIAGNOSIS — F05 Delirium due to known physiological condition: Secondary | ICD-10-CM | POA: Diagnosis not present

## 2021-09-28 DIAGNOSIS — E1165 Type 2 diabetes mellitus with hyperglycemia: Secondary | ICD-10-CM | POA: Diagnosis not present

## 2021-09-28 DIAGNOSIS — E876 Hypokalemia: Secondary | ICD-10-CM

## 2021-09-28 DIAGNOSIS — Z8673 Personal history of transient ischemic attack (TIA), and cerebral infarction without residual deficits: Secondary | ICD-10-CM

## 2021-09-28 DIAGNOSIS — Z7902 Long term (current) use of antithrombotics/antiplatelets: Secondary | ICD-10-CM

## 2021-09-28 DIAGNOSIS — Z833 Family history of diabetes mellitus: Secondary | ICD-10-CM

## 2021-09-28 DIAGNOSIS — I6601 Occlusion and stenosis of right middle cerebral artery: Secondary | ICD-10-CM | POA: Diagnosis not present

## 2021-09-28 DIAGNOSIS — R6520 Severe sepsis without septic shock: Secondary | ICD-10-CM | POA: Diagnosis present

## 2021-09-28 DIAGNOSIS — Z515 Encounter for palliative care: Secondary | ICD-10-CM

## 2021-09-28 DIAGNOSIS — G9341 Metabolic encephalopathy: Secondary | ICD-10-CM | POA: Diagnosis not present

## 2021-09-28 DIAGNOSIS — I10 Essential (primary) hypertension: Secondary | ICD-10-CM | POA: Diagnosis present

## 2021-09-28 DIAGNOSIS — A419 Sepsis, unspecified organism: Principal | ICD-10-CM | POA: Diagnosis present

## 2021-09-28 DIAGNOSIS — R443 Hallucinations, unspecified: Secondary | ICD-10-CM | POA: Diagnosis not present

## 2021-09-28 DIAGNOSIS — Z743 Need for continuous supervision: Secondary | ICD-10-CM | POA: Diagnosis not present

## 2021-09-28 DIAGNOSIS — Z87442 Personal history of urinary calculi: Secondary | ICD-10-CM

## 2021-09-28 DIAGNOSIS — R000 Tachycardia, unspecified: Secondary | ICD-10-CM | POA: Diagnosis not present

## 2021-09-28 DIAGNOSIS — R509 Fever, unspecified: Secondary | ICD-10-CM | POA: Diagnosis not present

## 2021-09-28 DIAGNOSIS — I6523 Occlusion and stenosis of bilateral carotid arteries: Secondary | ICD-10-CM | POA: Diagnosis not present

## 2021-09-28 DIAGNOSIS — R519 Headache, unspecified: Secondary | ICD-10-CM | POA: Diagnosis not present

## 2021-09-28 DIAGNOSIS — R69 Illness, unspecified: Secondary | ICD-10-CM | POA: Diagnosis not present

## 2021-09-28 DIAGNOSIS — I2609 Other pulmonary embolism with acute cor pulmonale: Secondary | ICD-10-CM | POA: Diagnosis not present

## 2021-09-28 DIAGNOSIS — R404 Transient alteration of awareness: Secondary | ICD-10-CM | POA: Diagnosis not present

## 2021-09-28 DIAGNOSIS — Z7189 Other specified counseling: Secondary | ICD-10-CM | POA: Diagnosis not present

## 2021-09-28 DIAGNOSIS — I6521 Occlusion and stenosis of right carotid artery: Secondary | ICD-10-CM | POA: Diagnosis not present

## 2021-09-28 DIAGNOSIS — R4182 Altered mental status, unspecified: Secondary | ICD-10-CM | POA: Diagnosis not present

## 2021-09-28 LAB — COMPREHENSIVE METABOLIC PANEL
ALT: 29 U/L (ref 0–44)
AST: 44 U/L — ABNORMAL HIGH (ref 15–41)
Albumin: 4.5 g/dL (ref 3.5–5.0)
Alkaline Phosphatase: 84 U/L (ref 38–126)
Anion gap: 15 (ref 5–15)
BUN: 21 mg/dL (ref 8–23)
CO2: 22 mmol/L (ref 22–32)
Calcium: 9.1 mg/dL (ref 8.9–10.3)
Chloride: 102 mmol/L (ref 98–111)
Creatinine, Ser: 1 mg/dL (ref 0.61–1.24)
GFR, Estimated: >60 mL/min (ref >60)
Glucose, Bld: 471 mg/dL — ABNORMAL HIGH (ref 70–99)
Potassium: 3.3 mmol/L — ABNORMAL LOW (ref 3.5–5.1)
Sodium: 139 mmol/L (ref 135–145)
Total Bilirubin: 1.6 mg/dL — ABNORMAL HIGH (ref 0.3–1.2)
Total Protein: 8.1 g/dL (ref 6.5–8.1)

## 2021-09-28 LAB — RESP PANEL BY RT-PCR (FLU A&B, COVID) ARPGX2
Influenza A by PCR: NEGATIVE
Influenza B by PCR: NEGATIVE
SARS Coronavirus 2 by RT PCR: NEGATIVE

## 2021-09-28 LAB — BLOOD GAS, ARTERIAL
Acid-Base Excess: 1.2 mmol/L (ref 0.0–2.0)
Bicarbonate: 23.8 mmol/L (ref 20.0–28.0)
Drawn by: 27016
FIO2: 36 %
O2 Saturation: 94.1 %
Patient temperature: 39.5
pCO2 arterial: 36 mmHg (ref 32–48)
pH, Arterial: 7.44 (ref 7.35–7.45)
pO2, Arterial: 77 mmHg — ABNORMAL LOW (ref 83–108)

## 2021-09-28 LAB — CBC WITH DIFFERENTIAL/PLATELET
Abs Immature Granulocytes: 0.12 10*3/uL — ABNORMAL HIGH (ref 0.00–0.07)
Basophils Absolute: 0 10*3/uL (ref 0.0–0.1)
Basophils Relative: 0 %
Eosinophils Absolute: 0 10*3/uL (ref 0.0–0.5)
Eosinophils Relative: 0 %
HCT: 47.8 % (ref 39.0–52.0)
Hemoglobin: 16.8 g/dL (ref 13.0–17.0)
Immature Granulocytes: 1 %
Lymphocytes Relative: 5 %
Lymphs Abs: 0.7 10*3/uL (ref 0.7–4.0)
MCH: 32.7 pg (ref 26.0–34.0)
MCHC: 35.1 g/dL (ref 30.0–36.0)
MCV: 93 fL (ref 80.0–100.0)
Monocytes Absolute: 0.9 10*3/uL (ref 0.1–1.0)
Monocytes Relative: 7 %
Neutro Abs: 12.4 10*3/uL — ABNORMAL HIGH (ref 1.7–7.7)
Neutrophils Relative %: 87 %
Platelets: 210 10*3/uL (ref 150–400)
RBC: 5.14 MIL/uL (ref 4.22–5.81)
RDW: 13.6 % (ref 11.5–15.5)
WBC: 14.2 10*3/uL — ABNORMAL HIGH (ref 4.0–10.5)
nRBC: 0 % (ref 0.0–0.2)

## 2021-09-28 LAB — URINALYSIS, ROUTINE W REFLEX MICROSCOPIC
Bilirubin Urine: NEGATIVE
Glucose, UA: >=500 mg/dL — AB
Ketones, ur: 20 mg/dL — AB
Leukocytes,Ua: NEGATIVE
Nitrite: NEGATIVE
Protein, ur: 100 mg/dL — AB
Specific Gravity, Urine: 1.029 (ref 1.005–1.030)
pH: 6 (ref 5.0–8.0)

## 2021-09-28 LAB — LACTIC ACID, PLASMA
Lactic Acid, Venous: 4.9 mmol/L (ref 0.5–1.9)
Lactic Acid, Venous: 3.2 mmol/L (ref 0.5–1.9)

## 2021-09-28 LAB — PROTIME-INR
INR: 1.1 (ref 0.8–1.2)
Prothrombin Time: 14.3 seconds (ref 11.4–15.2)

## 2021-09-28 LAB — PROCALCITONIN: Procalcitonin: <0.1 ng/mL

## 2021-09-28 LAB — RAPID URINE DRUG SCREEN, HOSP PERFORMED
Amphetamines: NOT DETECTED
Barbiturates: NOT DETECTED
Benzodiazepines: NOT DETECTED
Cocaine: NOT DETECTED
Opiates: NOT DETECTED
Tetrahydrocannabinol: NOT DETECTED

## 2021-09-28 LAB — APTT: aPTT: 32 seconds (ref 24–36)

## 2021-09-28 LAB — D-DIMER, QUANTITATIVE: D-Dimer, Quant: 19.12 ug/mL-FEU — ABNORMAL HIGH (ref 0.00–0.50)

## 2021-09-28 LAB — ETHANOL: Alcohol, Ethyl (B): <10 mg/dL (ref <10)

## 2021-09-28 LAB — CBG MONITORING, ED: Glucose-Capillary: 460 mg/dL — ABNORMAL HIGH (ref 70–99)

## 2021-09-28 MED ORDER — SODIUM CHLORIDE 0.9 % IV SOLN: 2.0000 g | Freq: Once | INTRAVENOUS | Status: DC

## 2021-09-28 MED ORDER — VANCOMYCIN HCL 1500 MG/300ML IV SOLN
1500.0000 mg | Freq: Once | INTRAVENOUS | Status: AC
  Administered 2021-09-28: 1500 mg via INTRAVENOUS
  Filled 2021-09-28: qty 300

## 2021-09-28 MED ORDER — VANCOMYCIN HCL IN DEXTROSE 1-5 GM/200ML-% IV SOLN: 1000.0000 mg | Freq: Once | INTRAVENOUS | Status: DC

## 2021-09-28 MED ORDER — TRAZODONE HCL 50 MG PO TABS
25.0000 mg | ORAL_TABLET | Freq: Every evening | ORAL | Status: DC | PRN
  Administered 2021-10-02 – 2021-10-06 (×3): 25 mg via ORAL
  Filled 2021-09-28 (×3): qty 1

## 2021-09-28 MED ORDER — ACETAMINOPHEN 650 MG RE SUPP
650.0000 mg | Freq: Four times a day (QID) | RECTAL | Status: DC | PRN
  Administered 2021-09-29: 650 mg via RECTAL
  Filled 2021-09-28: qty 1

## 2021-09-28 MED ORDER — MAGNESIUM HYDROXIDE 400 MG/5ML PO SUSP: 30.0000 mL | Freq: Every day | ORAL | Status: DC | PRN

## 2021-09-28 MED ORDER — VANCOMYCIN HCL 750 MG/150ML IV SOLN
750.0000 mg | Freq: Two times a day (BID) | INTRAVENOUS | Status: DC
  Administered 2021-09-29 – 2021-09-30 (×3): 750 mg via INTRAVENOUS
  Filled 2021-09-28 (×4): qty 150

## 2021-09-28 MED ORDER — ACETAMINOPHEN 650 MG RE SUPP
650.0000 mg | Freq: Once | RECTAL | Status: AC
  Administered 2021-09-28: 650 mg via RECTAL
  Filled 2021-09-28: qty 1

## 2021-09-28 MED ORDER — GLIPIZIDE 5 MG PO TABS: 10.0000 mg | ORAL_TABLET | Freq: Two times a day (BID) | ORAL | Status: DC

## 2021-09-28 MED ORDER — ONDANSETRON HCL 4 MG PO TABS: 4.0000 mg | ORAL_TABLET | Freq: Four times a day (QID) | ORAL | Status: DC | PRN

## 2021-09-28 MED ORDER — HYDROCHLOROTHIAZIDE 25 MG PO TABS: 25.0000 mg | ORAL_TABLET | Freq: Every day | ORAL | Status: DC

## 2021-09-28 MED ORDER — LACTATED RINGERS IV BOLUS: 1000.0000 mL | Freq: Once | INTRAVENOUS | Status: DC

## 2021-09-28 MED ORDER — SODIUM CHLORIDE 0.9 % IV BOLUS: 1000.0000 mL | Freq: Once | INTRAVENOUS | Status: DC

## 2021-09-28 MED ORDER — LACTATED RINGERS IV SOLN: INTRAVENOUS | Status: DC

## 2021-09-28 MED ORDER — LACTATED RINGERS IV BOLUS: 30.0000 mL/kg | Freq: Once | INTRAVENOUS | Status: DC

## 2021-09-28 MED ORDER — LACTATED RINGERS IV BOLUS
1000.0000 mL | Freq: Once | INTRAVENOUS | Status: AC
  Administered 2021-09-28: 1000 mL via INTRAVENOUS

## 2021-09-28 MED ORDER — ACETAMINOPHEN 325 MG PO TABS: 650.0000 mg | ORAL_TABLET | Freq: Four times a day (QID) | ORAL | Status: DC | PRN

## 2021-09-28 MED ORDER — ENOXAPARIN SODIUM 40 MG/0.4ML IJ SOSY
40.0000 mg | PREFILLED_SYRINGE | INTRAMUSCULAR | Status: DC
  Administered 2021-09-28: 40 mg via SUBCUTANEOUS
  Filled 2021-09-28: qty 0.4

## 2021-09-28 MED ORDER — METRONIDAZOLE 500 MG/100ML IV SOLN
500.0000 mg | Freq: Two times a day (BID) | INTRAVENOUS | Status: DC
  Administered 2021-09-29: 500 mg via INTRAVENOUS
  Filled 2021-09-28: qty 100

## 2021-09-28 MED ORDER — IOHEXOL 350 MG/ML SOLN
75.0000 mL | Freq: Once | INTRAVENOUS | Status: AC | PRN
  Administered 2021-09-28: 75 mL via INTRAVENOUS

## 2021-09-28 MED ORDER — SODIUM CHLORIDE 0.9 % IV SOLN
2.0000 g | Freq: Two times a day (BID) | INTRAVENOUS | Status: DC
  Administered 2021-09-29: 2 g via INTRAVENOUS
  Filled 2021-09-28: qty 12.5

## 2021-09-28 MED ORDER — SODIUM CHLORIDE 0.9 % IV SOLN
2.0000 g | Freq: Once | INTRAVENOUS | Status: AC
  Administered 2021-09-28: 2 g via INTRAVENOUS
  Filled 2021-09-28: qty 12.5

## 2021-09-28 MED ORDER — ASPIRIN EC 81 MG PO TBEC: 81.0000 mg | DELAYED_RELEASE_TABLET | Freq: Every day | ORAL | Status: DC

## 2021-09-28 MED ORDER — METRONIDAZOLE 500 MG/100ML IV SOLN
500.0000 mg | Freq: Once | INTRAVENOUS | Status: AC
  Administered 2021-09-28: 500 mg via INTRAVENOUS
  Filled 2021-09-28: qty 100

## 2021-09-28 MED ORDER — POVIDONE-IODINE 10 % EX SOLN
CUTANEOUS | Status: AC
  Administered 2021-09-28: 2
  Filled 2021-09-28: qty 29.6

## 2021-09-28 MED ORDER — LISINOPRIL 10 MG PO TABS: 20.0000 mg | ORAL_TABLET | Freq: Every day | ORAL | Status: DC

## 2021-09-28 MED ORDER — LABETALOL HCL 5 MG/ML IV SOLN
20.0000 mg | INTRAVENOUS | Status: DC | PRN
  Administered 2021-09-29 – 2021-10-02 (×6): 20 mg via INTRAVENOUS
  Filled 2021-09-28 (×5): qty 4

## 2021-09-28 MED ORDER — LISINOPRIL-HYDROCHLOROTHIAZIDE 20-25 MG PO TABS: 1.0000 | ORAL_TABLET | Freq: Every day | ORAL | Status: DC

## 2021-09-28 MED ORDER — SODIUM CHLORIDE 0.9 % IV SOLN: INTRAVENOUS | Status: DC

## 2021-09-28 MED ORDER — ONDANSETRON HCL 4 MG/2ML IJ SOLN
4.0000 mg | Freq: Four times a day (QID) | INTRAMUSCULAR | Status: DC | PRN
Start: 2021-09-28 — End: 2021-10-07

## 2021-09-28 NOTE — ED Notes (Signed)
Notified hospitalist patient unable to safely  swallow PO medications. Requests IV medications ?

## 2021-09-28 NOTE — H&P (Addendum)
?  ?  ?[facility] ? ? ?PATIENT NAME: Calvin Mills   ? ?MR#:  [id number] ? ?DATE OF BIRTH:  06/16/1940 ? ?DATE OF ADMISSION:  09/28/2021 ? ?PRIMARY CARE PHYSICIAN: Redmond School, MD  ? ?Patient is coming from: Home ? ?REQUESTING/REFERRING PHYSICIAN: Evalee Jefferson, PA-C ? ?CHIEF COMPLAINT:  ? ?Chief Complaint  ?Patient presents with  ? Altered Mental Status  ? ? ?HISTORY OF PRESENT ILLNESS:  ?AADHAV UHLIG is a 81 y.o. Caucasian male with medical history significant for type 2 diabetes mellitus, hypertension, dyslipidemia and urolithiasis, who presented to the emergency room with acute onset of altered mental status with unresponsiveness.  The patient's son who closely attentive his father has been monitoring him with a ring camera which stopped giving any drinks from 1 PM when they went to his home and found him in the bathroom tub covered in feces and was unresponsive and later lethargic.  He was noted to be hypoxic by EMS as well and was placed on O2 that was up to 4 L/min.  On room air his pulse oximetry was down to 88%.  No reported chest pain or palpitations or cough or wheezing.  No reported urinary symptoms.  The patient was very lethargic and barely arousable and therefore no history could be obtained from the patient.  Most of the history was obtained from the patient's son. ? ?ED Course: Upon presentation to the emergency room, BP was 184/101 with temperature 103.3 and heart rate 128 with pulse oximetry of 93 to 93% on 4 L of O2 by nasal cannula.  UA came back with 11-20 WBCs and rare bacteria with 100 protein and more than 500 glucose with 20 ketones.  ABG showed pH 7.44 PCO2 of 36 and PO2 of 77 and HCO3 of 23.8 with O2 sat of 94.1% on 4 L of O2 by nasal cannula.  CMP revealed potassium of 3.3 and blood glucose of 471 AST 44 and total bili 1.6.  Lactic acid was 4.9 and later 3.2 with procalcitonin of less than 0.1.  CBC showed leukocytosis of 14.2 with neutrophilia.  I added a D-dimer and it came back  significantly elevated at 19.12. ?EKG as reviewed by me : EKG showed atrial fibrillation with RVR of 124 with borderline left axis deviation and abnormal R wave progression.  It showed borderline prolonged QT interval with QTc of 499 MS. ?Imaging: Chest x-ray showed no acute cardiopulmonary disease. ? ?Chest CTA revealed: ?Mild pulmonary emboli involving the right upper lobe. No significant ?right heart strain is noted. ?  ?Cholelithiasis without complicating factors. ?  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 Recommend annual ?imaging followup by CTA or MRA. This recommendation follows 2010 ?ACCF/AHA/AATS/ACR/ASA/SCA/SCAI/SIR/STS/SVM Guidelines for the ?Diagnosis and Management of Patients with Thoracic Aortic Disease. ?Circulation. 2010; 121: H702-O378. Aortic aneurysm NOS (ICD10-I71.9) ?  ?Mild bibasilar atelectasis. ?  ?Aortic Atherosclerosis and emphysema. ? ?The patient was given IV vancomycin, cefepime and Flagyl and 2 L bolus of IV lactated Ringer as well as 650 mg of p.o. Tylenol.  After results of the patient's chest CTA she was immediately placed on IV heparin.  She  is admitted to a stepdown unit bed for further evaluation and management. ?PAST MEDICAL HISTORY:  ? ?Past Medical History:  ?Diagnosis Date  ? Diabetes ([place])   ? High cholesterol   ? Hypertension   ? Kidney stone   ? ? ?PAST SURGICAL HISTORY:  ? ?Past Surgical History:  ?Procedure Laterality Date  ?  COLONOSCOPY N/A 05/02/2014  ? Procedure: COLONOSCOPY;  Surgeon: Rogene Houston, MD;  [facility];  Service: Endoscopy;  Laterality: N/A;  145 - moved to 9:30 - Ann notified pt  ? COLONOSCOPY N/A 05/14/2015  ? Procedure: COLONOSCOPY;  Surgeon: Rogene Houston, MD;  Location: [facility];  Service: Endoscopy;  Laterality: N/A;  9:30  ? EYE SURGERY    ? 15 yrs ago: torn retina  ? TONSILLECTOMY    ? ? ?SOCIAL HISTORY:  ? ?Social History  ? ?Tobacco Use  ? Smoking status: Former  ?  Packs/day: 0.50  ?  Years: 30.00  ?  Pack years:  15.00  ?  Types: Cigarettes  ? Smokeless tobacco: Not on file  ? Tobacco comments:  ?  quit 25 yrs ago  ?Substance Use Topics  ? Alcohol use: No  ?  Alcohol/week: 0.0 standard drinks  ? ? ?FAMILY HISTORY:  ?Positive for diabetes mellitus in his father. ? ?DRUG ALLERGIES:  ?No Known Allergies ? ?REVIEW OF SYSTEMS:  ? ?ROS ?As per history of present illness. All pertinent systems were reviewed above. Constitutional, HEENT, cardiovascular, respiratory, GI, GU, musculoskeletal, neuro, psychiatric, endocrine, integumentary and hematologic systems were reviewed and are otherwise negative/unremarkable except for positive findings mentioned above in the HPI. ? ? ?MEDICATIONS AT HOME:  ? ?Prior to Admission medications   ?Medication Sig Start Date End Date Taking? Authorizing Provider  ?acetaminophen (TYLENOL) 325 MG tablet Take 2 tablets (650 mg total) by mouth every 4 (four) hours as needed for mild pain (or temp > 37.5 C (99.5 F)). 05/29/21  Yes Roxan Hockey, MD  ?aspirin EC 81 MG EC tablet Take 1 tablet (81 mg total) by mouth daily with breakfast. Please take Aspirin 81 mg daily along with Plavix 75 mg daily for 21 days then after that STOP the Plavix  and continue ONLY Aspirin 81 mg daily indefinitely--for  stroke Prevention ?Patient taking differently: Take 81 mg by mouth daily with breakfast. 05/29/21  Yes Emokpae, Courage, MD  ?glipiZIDE (GLUCOTROL) 10 MG tablet Take 1 tablet (10 mg total) by mouth 2 (two) times daily before a meal. 05/29/21  Yes Emokpae, Courage, MD  ?indomethacin (INDOCIN) 50 MG capsule Take 50 mg by mouth 2 (two) times daily. 09/04/21  Yes [provider]  ?lisinopril-hydrochlorothiazide (PRINZIDE,ZESTORETIC) 20-25 MG per tablet Take 1 tablet by mouth daily.   Yes [provider]  ?metFORMIN (GLUCOPHAGE) 1000 MG tablet Take 1 tablet (1,000 mg total) by mouth 2 (two) times daily with a meal. 05/29/21  Yes Emokpae, Courage, MD  ?clopidogrel (PLAVIX) 75 MG tablet Take 1 tablet (75 mg  total) by mouth daily. Please take Aspirin 81 mg daily along with Plavix 75 mg daily for 21 days then after that STOP the Plavix  and continue ONLY Aspirin 81 mg daily indefinitely--for  stroke Prevention ?Patient not taking: Reported on 09/28/2021 05/30/21   Roxan Hockey, MD  ? ?  ? ?VITAL SIGNS:  ?Blood pressure 121/84, pulse (!) 129, temperature 100.3 ?F (37.9 ?C), temperature source Rectal, resp. rate (!) 21, height '5\' 10"'$  (1.778 m), weight 72.5 kg, SpO2 96 %. ? ?PHYSICAL EXAMINATION:  ?Physical Exam ? ?GENERAL:  81 y.o.-year-old Caucasian patient lying in the bed with no acute distress.  He was very somnolent and barely arousable to his name. ?EYES: Pupils equal, round, reactive to light and accommodation. No scleral icterus. Extraocular muscles intact.  ?HEENT: Head atraumatic, normocephalic. Oropharynx and nasopharynx clear.  ?NECK:  Supple,  no jugular venous distention. No thyroid enlargement, no tenderness.  ?LUNGS: Normal breath sounds bilaterally, no wheezing, rales,rhonchi or crepitation. No use of accessory muscles of respiration.  ?CARDIOVASCULAR: Regular rate and rhythm, S1, S2 normal. No murmurs, rubs, or gallops.  ?ABDOMEN: Soft, nondistended, nontender. Bowel sounds present. No organomegaly or mass.  ?EXTREMITIES: No pedal edema, cyanosis, or clubbing.  ?NEUROLOGIC: Cranial nerves II through XII are intact. Muscle strength 5/5 in all extremities. Sensation intact. Gait not checked.  ?PSYCHIATRIC: The patient is alert and oriented x 3.  Normal affect and good eye contact. ?SKIN: No obvious rash, lesion, or ulcer.  ? ?LABORATORY PANEL:  ? ?CBC ?Recent Labs  ?Lab 09/28/21 ?1518  ?WBC 14.2*  ?HGB 16.8  ?HCT 47.8  ?PLT 210  ? ?------------------------------------------------------------------------------------------------------------------ ? ?Chemistries  ?Recent Labs  ?Lab 09/28/21 ?1518  ?NA 139  ?K 3.3*  ?CL 102  ?CO2 22  ?GLUCOSE 471*  ?BUN 21  ?CREATININE 1.00  ?CALCIUM 9.1  ?AST 44*  ?ALT 29   ?ALKPHOS 84  ?BILITOT 1.6*  ? ?------------------------------------------------------------------------------------------------------------------ ? ?Cardiac Enzymes ?No results for input(s): TROPONINI in the l

## 2021-09-28 NOTE — Sepsis Progress Note (Signed)
Sepsis protocol is being followed by [facility]. 

## 2021-09-28 NOTE — Progress Notes (Signed)
Pharmacy Antibiotic Note ? ?Calvin Mills a 81 y.o. male admitted on 09/28/2021 with sepsis.  Pharmacy has been consulted for vancomycin and cefepime dosing. ? ?Plan: ?Vancomycin '750mg'$  IV every 12 hours.  Goal trough 15-20 mcg/mL. ?Cefepime 2gm IV every 12 hours. ? ?Medical History: ?Past Medical History:  ?Diagnosis Date  ? Diabetes ([place])   ? High cholesterol   ? Hypertension   ? Kidney stone   ? ? ?Allergies:  ?No Known Allergies ? ?Filed Weights  ? 09/28/21 1531  ?Weight: 72.5 kg (159 lb 14.4 oz)  ? ? ? ?  Latest Ref Rng & Units 09/28/2021  ?  3:18 PM 05/28/2021  ?  5:32 PM 05/28/2021  ?  5:24 PM  ?CBC  ?WBC 4.0 - 10.5 K/uL 14.2    7.6    ?Hemoglobin 13.0 - 17.0 g/dL 16.8   16.0   15.9    ?Hematocrit 39.0 - 52.0 % 47.8   47.0   44.6    ?Platelets 150 - 400 K/uL 210    187    ? ? ? ?Estimated Creatinine Clearance: 59.4 mL/min (by C-G formula based on SCr of 1 mg/dL). ? ?Antibiotics Given (last 72 hours)   ? ? Date/Time Action Medication Dose Rate  ? 09/28/21 1522 New Bag/Given  ? metroNIDAZOLE (FLAGYL) IVPB 500 mg 500 mg 100 mL/hr  ? ?  ? ? ?Antimicrobials this admission: ? ?Cefepime 09/28/2021  >>  ?vancomycin 09/28/2021  >>  ?Metronidazole 09/28/2021   x 1  ? ?Microbiology results: ?09/28/2021  BCx: sent ?09/28/2021  UCx: sent ?09/28/2021  Resp Panel: sent  ?09/28/2021  MRSA PCR: sent ? ?Thank you for allowing pharmacy to be a part of this patient?s care. ? ?Thomasenia Sales, PharmD ?Clinical Pharmacist ? ? ? ?

## 2021-09-28 NOTE — ED Notes (Signed)
Patient  turned and repositioned  in bed. Notified EDP heart rate and blood pressure remains elevated.  ?

## 2021-09-28 NOTE — ED Triage Notes (Signed)
Pt arrived by EMS pt was found by family unresponsive in bathtub and covered in feces.  ? ?Pt responds to pain and not responding verbally at this time.  ? ?Pt has elevated glucose at 482 for EMS ?Family states pt is normally alert and oriented at baseline.  ?

## 2021-09-28 NOTE — ED Notes (Signed)
Performing pericare, patient voided. Collected urine specimen. ?

## 2021-09-28 NOTE — ED Provider Notes (Signed)
?Calvin Mills ?Provider Note ? ? ?CSN: [id number] ?Arrival date & time: 09/28/21  1422 ? ?  ? ?History ? ?Chief Complaint  ?Patient presents with  ? Altered Mental Status  ? ? ?Calvin Mills is a 81 y.o. male with a history including diabetes, hypertension, hyperlipidemia who is relatively independent person, lives in his own home was found around 62 today by his son who came to check on him, laying in the bathtub unresponsive and covered in feces.  The patient is unable to give any medical history given altered mental status.  Phone conversation with son Calvin Mills states that his father was last seen apparently well around 5 AM as he states they have security monitors in his home and they reviewed the footage and they observed him getting up around 5 AM apparently to go to the bathroom.  It was not until 130 when the son came and found him in the bathtub.  His CBG in route was 482.  Son states that he saw his father yesterday and had no complaints of any symptoms. ? ?The history is provided by a relative. The history is limited by the condition of the patient.  ? ?  ? ?Home Medications ?Prior to Admission medications   ?Medication Sig Start Date End Date Taking? Authorizing Provider  ?acetaminophen (TYLENOL) 325 MG tablet Take 2 tablets (650 mg total) by mouth every 4 (four) hours as needed for mild pain (or temp > 37.5 C (99.5 F)). 05/29/21  Yes Roxan Hockey, MD  ?aspirin EC 81 MG EC tablet Take 1 tablet (81 mg total) by mouth daily with breakfast. Please take Aspirin 81 mg daily along with Plavix 75 mg daily for 21 days then after that STOP the Plavix  and continue ONLY Aspirin 81 mg daily indefinitely--for  stroke Prevention ?Patient taking differently: Take 81 mg by mouth daily with breakfast. 05/29/21  Yes Emokpae, Courage, MD  ?glipiZIDE (GLUCOTROL) 10 MG tablet Take 1 tablet (10 mg total) by mouth 2 (two) times daily before a meal. 05/29/21  Yes Emokpae, Courage, MD  ?indomethacin (INDOCIN)  50 MG capsule Take 50 mg by mouth 2 (two) times daily. 09/04/21  Yes [provider]  ?lisinopril-hydrochlorothiazide (PRINZIDE,ZESTORETIC) 20-25 MG per tablet Take 1 tablet by mouth daily.   Yes [provider]  ?metFORMIN (GLUCOPHAGE) 1000 MG tablet Take 1 tablet (1,000 mg total) by mouth 2 (two) times daily with a meal. 05/29/21  Yes Emokpae, Courage, MD  ?clopidogrel (PLAVIX) 75 MG tablet Take 1 tablet (75 mg total) by mouth daily. Please take Aspirin 81 mg daily along with Plavix 75 mg daily for 21 days then after that STOP the Plavix  and continue ONLY Aspirin 81 mg daily indefinitely--for  stroke Prevention ?Patient not taking: Reported on 09/28/2021 05/30/21   Roxan Hockey, MD  ?   ? ?Allergies    ?Patient has no known allergies.   ? ?Review of Systems   ?Review of Systems  ?Unable to perform ROS: Patient unresponsive  ? ?Physical Exam ?Updated Vital Signs ?BP (!) 172/109   Pulse (!) 135   Temp 100.3 ?F (37.9 ?C) (Rectal)   Resp (!) 24   Ht '5\' 10"'$  (1.778 m)   Wt 72.5 kg   SpO2 94%   BMI 22.94 kg/m?  ?Physical Exam ?Vitals and nursing note reviewed.  ?Constitutional:   ?   General: He is in acute distress.  ?   Appearance: He is well-developed.  ?  Comments: Responds briefly to sternal rub.  Febrile. Presents covered in feces.  ?HENT:  ?   Head: Normocephalic and atraumatic.  ?   Mouth/Throat:  ?   Mouth: Mucous membranes are dry.  ?Eyes:  ?   Conjunctiva/sclera: Conjunctivae normal.  ?   Pupils: Pupils are equal, round, and reactive to light.  ?Cardiovascular:  ?   Rate and Rhythm: Regular rhythm. Tachycardia present.  ?   Heart sounds: Normal heart sounds.  ?Pulmonary:  ?   Effort: Pulmonary effort is normal. No respiratory distress.  ?   Breath sounds: Normal breath sounds. No wheezing.  ?Abdominal:  ?   General: Bowel sounds are normal. There is no distension.  ?   Palpations: Abdomen is soft.  ?   Tenderness: There is no abdominal tenderness. There is no guarding.   ?Musculoskeletal:  ?   Cervical back: Normal range of motion.  ?Skin: ?   General: Skin is warm and dry.  ?Neurological:  ?   Mental Status: He is lethargic.  ?   GCS: GCS eye subscore is 2. GCS verbal subscore is 1. GCS motor subscore is 5.  ? ? ?ED Results / Procedures / Treatments   ?Labs ?(all labs ordered are listed, but only abnormal results are displayed) ?Labs Reviewed  ?COMPREHENSIVE METABOLIC PANEL - Abnormal; Notable for the following components:  ?    Result Value  ? Potassium 3.3 (*)   ? Glucose, Bld 471 (*)   ? AST 44 (*)   ? Total Bilirubin 1.6 (*)   ? All other components within normal limits  ?CBC WITH DIFFERENTIAL/PLATELET - Abnormal; Notable for the following components:  ? WBC 14.2 (*)   ? Neutro Abs 12.4 (*)   ? Abs Immature Granulocytes 0.12 (*)   ? All other components within normal limits  ?URINALYSIS, ROUTINE W REFLEX MICROSCOPIC - Abnormal; Notable for the following components:  ? Glucose, UA >=500 (*)   ? Hgb urine dipstick LARGE (*)   ? Ketones, ur 20 (*)   ? Protein, ur 100 (*)   ? Bacteria, UA RARE (*)   ? All other components within normal limits  ?LACTIC ACID, PLASMA - Abnormal; Notable for the following components:  ? Lactic Acid, Venous 4.9 (*)   ? All other components within normal limits  ?BLOOD GAS, ARTERIAL - Abnormal; Notable for the following components:  ? pO2, Arterial 77 (*)   ? All other components within normal limits  ?LACTIC ACID, PLASMA - Abnormal; Notable for the following components:  ? Lactic Acid, Venous 3.2 (*)   ? All other components within normal limits  ?CBG MONITORING, ED - Abnormal; Notable for the following components:  ? Glucose-Capillary 460 (*)   ? All other components within normal limits  ?CULTURE, BLOOD (ROUTINE X 2)  ?CULTURE, BLOOD (ROUTINE X 2)  ?RESP PANEL BY RT-PCR (FLU A&B, COVID) [id number]  ?URINE CULTURE  ?CSF CULTURE W GRAM STAIN  ?RAPID URINE DRUG SCREEN, HOSP PERFORMED  ?ETHANOL  ?PROTIME-INR  ?APTT  ?PROCALCITONIN  ?CSF CELL COUNT WITH  DIFFERENTIAL  ?PROTEIN AND GLUCOSE, CSF  ?PROTIME-INR  ?CORTISOL-AM, BLOOD  ?BASIC METABOLIC PANEL  ?CBC  ? ? ?EKG ?EKG Interpretation ? ?Date/Time:  Sunday Sep 28 2021 15:28:25 EDT ?Ventricular Rate:  124 ?PR Interval:    ?QRS Duration: 91 ?QT Interval:  347 ?QTC Calculation: 499 ?R Axis:   -29 ?Text Interpretation: Sinus rhythm Borderline left axis deviation Abnormal R-wave progression, early transition Nonspecific T abnormalities, lateral  leads Borderline prolonged QT interval Confirmed by Davonna Belling [phone]) on 09/28/2021 4:50:55 PM ? ?Radiology ?CT ANGIO HEAD NECK W WO CM ? ?Result Date: 09/28/2021 ?CLINICAL DATA:  Neuro deficit, acute, stroke suspected. Altered mental status. Found unresponsive today. EXAM: CT ANGIOGRAPHY HEAD AND NECK TECHNIQUE: Multidetector CT imaging of the head and neck was performed using the standard protocol during bolus administration of intravenous contrast. Multiplanar CT image reconstructions and MIPs were obtained to evaluate the vascular anatomy. Carotid stenosis measurements (when applicable) are obtained utilizing NASCET criteria, using the distal internal carotid diameter as the denominator. RADIATION DOSE REDUCTION: This exam was performed according to the departmental dose-optimization program which includes automated exposure control, adjustment of the mA and/or kV according to patient size and/or use of iterative reconstruction technique. CONTRAST:  62m OMNIPAQUE IOHEXOL 350 MG/ML SOLN COMPARISON:  Head and neck MRA 05/29/2021 FINDINGS: CTA NECK FINDINGS Aortic arch: Standard 3 vessel aortic arch with moderate soft and calcified plaque. Patent brachiocephalic and subclavian arteries with 50% stenosis of the left subclavian origin. Right carotid system: Patent with a mild-to-moderate amount of calcified and soft plaque at the carotid bifurcation. No evidence of a significant stenosis or dissection. Left carotid system: Patent with scattered soft plaque in the common  carotid artery and a small to moderate amount of calcified plaque the carotid bifurcation. No evidence of a significant stenosis or dissection. Vertebral arteries: Patent with calcified plaque at the left greater than right ver

## 2021-09-29 ENCOUNTER — Inpatient Hospital Stay (HOSPITAL_COMMUNITY): Payer: Medicare HMO

## 2021-09-29 ENCOUNTER — Inpatient Hospital Stay (HOSPITAL_COMMUNITY)
Admit: 2021-09-29 | Discharge: 2021-09-29 | Disposition: A | Discharge status: Home / Self Care | Payer: Medicare HMO | Attending: Family Medicine | Admitting: Family Medicine

## 2021-09-29 DIAGNOSIS — A419 Sepsis, unspecified organism: Secondary | ICD-10-CM | POA: Diagnosis not present

## 2021-09-29 DIAGNOSIS — I2699 Other pulmonary embolism without acute cor pulmonale: Secondary | ICD-10-CM

## 2021-09-29 DIAGNOSIS — E876 Hypokalemia: Secondary | ICD-10-CM

## 2021-09-29 DIAGNOSIS — I2690 Septic pulmonary embolism without acute cor pulmonale: Secondary | ICD-10-CM | POA: Diagnosis not present

## 2021-09-29 DIAGNOSIS — I10 Essential (primary) hypertension: Secondary | ICD-10-CM | POA: Diagnosis not present

## 2021-09-29 DIAGNOSIS — R4182 Altered mental status, unspecified: Secondary | ICD-10-CM

## 2021-09-29 DIAGNOSIS — I2609 Other pulmonary embolism with acute cor pulmonale: Secondary | ICD-10-CM

## 2021-09-29 DIAGNOSIS — I4891 Unspecified atrial fibrillation: Secondary | ICD-10-CM | POA: Diagnosis not present

## 2021-09-29 DIAGNOSIS — E1165 Type 2 diabetes mellitus with hyperglycemia: Secondary | ICD-10-CM

## 2021-09-29 DIAGNOSIS — J9601 Acute respiratory failure with hypoxia: Secondary | ICD-10-CM

## 2021-09-29 LAB — CORTISOL-AM, BLOOD: Cortisol - AM: 50.6 ug/dL — ABNORMAL HIGH (ref 6.7–22.6)

## 2021-09-29 LAB — CBC
HCT: 46.3 % (ref 39.0–52.0)
Hemoglobin: 15.8 g/dL (ref 13.0–17.0)
MCH: 32.6 pg (ref 26.0–34.0)
MCHC: 34.1 g/dL (ref 30.0–36.0)
MCV: 95.7 fL (ref 80.0–100.0)
Platelets: 171 10*3/uL (ref 150–400)
RBC: 4.84 MIL/uL (ref 4.22–5.81)
RDW: 13.9 % (ref 11.5–15.5)
WBC: 13.3 10*3/uL — ABNORMAL HIGH (ref 4.0–10.5)
nRBC: 0 % (ref 0.0–0.2)

## 2021-09-29 LAB — ECHOCARDIOGRAM COMPLETE
AR max vel: 2.66 cm2
AV Area VTI: 2.99 cm2
AV Area mean vel: 2.54 cm2
AV Mean grad: 3.1 mmHg
AV Peak grad: 5.8 mmHg
Ao pk vel: 1.2 m/s
Area-P 1/2: 2.26 cm2
Height: 70 in
S' Lateral: 2.5 cm
Weight: 2500.9 oz

## 2021-09-29 LAB — BLOOD GAS, ARTERIAL
Acid-Base Excess: 6.3 mmol/L — ABNORMAL HIGH (ref 0.0–2.0)
Bicarbonate: 29.5 mmol/L — ABNORMAL HIGH (ref 20.0–28.0)
Drawn by: 41977
FIO2: 32 %
O2 Saturation: 97 %
Patient temperature: 37.2
pCO2 arterial: 37 mmHg (ref 32–48)
pH, Arterial: 7.51 — ABNORMAL HIGH (ref 7.35–7.45)
pO2, Arterial: 78 mmHg — ABNORMAL LOW (ref 83–108)

## 2021-09-29 LAB — BASIC METABOLIC PANEL
Anion gap: 9 (ref 5–15)
BUN: 24 mg/dL — ABNORMAL HIGH (ref 8–23)
CO2: 27 mmol/L (ref 22–32)
Calcium: 8.7 mg/dL — ABNORMAL LOW (ref 8.9–10.3)
Chloride: 109 mmol/L (ref 98–111)
Creatinine, Ser: 0.85 mg/dL (ref 0.61–1.24)
GFR, Estimated: >60 mL/min (ref >60)
Glucose, Bld: 399 mg/dL — ABNORMAL HIGH (ref 70–99)
Potassium: 3.4 mmol/L — ABNORMAL LOW (ref 3.5–5.1)
Sodium: 145 mmol/L (ref 135–145)

## 2021-09-29 LAB — GLUCOSE, CAPILLARY
Glucose-Capillary: 106 mg/dL — ABNORMAL HIGH (ref 70–99)
Glucose-Capillary: 225 mg/dL — ABNORMAL HIGH (ref 70–99)
Glucose-Capillary: 325 mg/dL — ABNORMAL HIGH (ref 70–99)
Glucose-Capillary: 340 mg/dL — ABNORMAL HIGH (ref 70–99)
Glucose-Capillary: 355 mg/dL — ABNORMAL HIGH (ref 70–99)
Glucose-Capillary: 359 mg/dL — ABNORMAL HIGH (ref 70–99)

## 2021-09-29 LAB — PROTIME-INR
INR: 1.2 (ref 0.8–1.2)
Prothrombin Time: 15.4 seconds — ABNORMAL HIGH (ref 11.4–15.2)

## 2021-09-29 LAB — MRSA NEXT GEN BY PCR, NASAL: MRSA by PCR Next Gen: NOT DETECTED

## 2021-09-29 LAB — HEPARIN LEVEL (UNFRACTIONATED)
Heparin Unfractionated: 0.79 IU/mL — ABNORMAL HIGH (ref 0.30–0.70)
Heparin Unfractionated: 0.37 IU/mL (ref 0.30–0.70)

## 2021-09-29 LAB — LACTIC ACID, PLASMA: Lactic Acid, Venous: 1.5 mmol/L (ref 0.5–1.9)

## 2021-09-29 LAB — AMMONIA: Ammonia: 47 umol/L — ABNORMAL HIGH (ref 9–35)

## 2021-09-29 MED ORDER — SODIUM CHLORIDE 0.9 % IV SOLN
2.0000 g | Freq: Three times a day (TID) | INTRAVENOUS | Status: DC
  Administered 2021-09-29: 2 g via INTRAVENOUS
  Filled 2021-09-29: qty 12.5

## 2021-09-29 MED ORDER — HEPARIN (PORCINE) 25000 UT/250ML-% IV SOLN
1100.0000 [IU]/h | INTRAVENOUS | Status: AC
  Administered 2021-09-29: 1200 [IU]/h via INTRAVENOUS
  Administered 2021-09-29: 1100 [IU]/h via INTRAVENOUS
  Filled 2021-09-29 (×2): qty 250

## 2021-09-29 MED ORDER — ACETAMINOPHEN 325 MG PO TABS: 650.0000 mg | ORAL_TABLET | Freq: Once | ORAL | Status: DC

## 2021-09-29 MED ORDER — HYDRALAZINE HCL 20 MG/ML IJ SOLN
10.0000 mg | Freq: Four times a day (QID) | INTRAMUSCULAR | Status: DC | PRN
  Administered 2021-09-29 – 2021-10-04 (×5): 10 mg via INTRAVENOUS
  Filled 2021-09-29 (×6): qty 1

## 2021-09-29 MED ORDER — SODIUM CHLORIDE 0.9 % IV SOLN
2.0000 g | Freq: Two times a day (BID) | INTRAVENOUS | Status: DC
  Administered 2021-09-29 – 2021-09-30 (×2): 2 g via INTRAVENOUS
  Filled 2021-09-29 (×2): qty 20

## 2021-09-29 MED ORDER — CHLORHEXIDINE GLUCONATE CLOTH 2 % EX PADS
6.0000 | MEDICATED_PAD | Freq: Every day | CUTANEOUS | Status: DC
  Administered 2021-09-29 – 2021-10-07 (×9): 6 via TOPICAL

## 2021-09-29 MED ORDER — SODIUM CHLORIDE 0.9 % IV SOLN
2.0000 g | INTRAVENOUS | Status: DC
  Administered 2021-09-29 – 2021-09-30 (×6): 2 g via INTRAVENOUS
  Filled 2021-09-29 (×9): qty 2000
  Filled 2021-09-29: qty 2
  Filled 2021-09-29 (×2): qty 2000

## 2021-09-29 MED ORDER — HEPARIN BOLUS VIA INFUSION
2000.0000 [IU] | Freq: Once | INTRAVENOUS | Status: AC
  Administered 2021-09-29: 2000 [IU] via INTRAVENOUS
  Filled 2021-09-29: qty 2000

## 2021-09-29 MED ORDER — INSULIN ASPART 100 UNIT/ML IJ SOLN
0.0000 [IU] | Freq: Three times a day (TID) | INTRAMUSCULAR | Status: DC
  Administered 2021-09-29 (×2): 15 [IU] via SUBCUTANEOUS

## 2021-09-29 MED ORDER — GADOBUTROL 1 MMOL/ML IV SOLN
7.0000 mL | Freq: Once | INTRAVENOUS | Status: AC | PRN
  Administered 2021-09-29: 7 mL via INTRAVENOUS

## 2021-09-29 MED ORDER — DILTIAZEM HCL 25 MG/5ML IV SOLN
5.0000 mg | Freq: Once | INTRAVENOUS | Status: AC
  Administered 2021-09-29: 5 mg via INTRAVENOUS
  Filled 2021-09-29: qty 5

## 2021-09-29 MED ORDER — IOHEXOL 350 MG/ML SOLN
75.0000 mL | Freq: Once | INTRAVENOUS | Status: AC | PRN
  Administered 2021-09-29: 75 mL via INTRAVENOUS

## 2021-09-29 MED ORDER — INSULIN ASPART 100 UNIT/ML IJ SOLN
0.0000 [IU] | INTRAMUSCULAR | Status: DC
  Administered 2021-09-29: 7 [IU] via SUBCUTANEOUS
  Administered 2021-09-30: 4 [IU] via SUBCUTANEOUS
  Administered 2021-09-30 (×3): 3 [IU] via SUBCUTANEOUS
  Administered 2021-09-30: 4 [IU] via SUBCUTANEOUS
  Administered 2021-09-30: 3 [IU] via SUBCUTANEOUS
  Administered 2021-10-01 (×4): 4 [IU] via SUBCUTANEOUS

## 2021-09-29 MED ORDER — DEXTROSE 5 % IV SOLN
10.0000 mg/kg | Freq: Three times a day (TID) | INTRAVENOUS | Status: DC
  Administered 2021-09-29 – 2021-10-02 (×9): 710 mg via INTRAVENOUS
  Filled 2021-09-29 (×12): qty 14.2

## 2021-09-29 NOTE — Assessment & Plan Note (Addendum)
-   The patient will be placed on supplement coverage with resistant NovoLog protocol. ?- The patient received 2 L bolus of IV lactated Ringer and will be continued on IV normal saline. ?- We will continue Glucotrol. ?- We will hold off metformin. ?

## 2021-09-29 NOTE — Progress Notes (Signed)
Pharmacy Antibiotic Note ? ?Calvin Mills a 81 y.o. 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  Pharmacy has been consulted for acyclovir dosing. ? ?Plan: ?Acyclovir 710 mg IV every 8 hours. ?Ceftriaxone 2000 mg IV every 12 hours. ?Ampicillin 2000 mg IV every 4 hours. ?Vanco 750 mg IV every 12 hours. ? ?Medical History: ?Past Medical History:  ?Diagnosis Date  ? Diabetes ([place])   ? High cholesterol   ? Hypertension   ? Kidney stone   ? ? ?Allergies:  ?No Known Allergies ? ?Filed Weights  ? 09/28/21 1531 09/29/21 0500  ?Weight: 72.5 kg (159 lb 14.4 oz) 70.9 kg (156 lb 4.9 oz)  ? ? ? ?  Latest Ref Rng & Units 09/29/2021  ?  4:00 AM 09/28/2021  ?  3:18 PM 05/28/2021  ?  5:32 PM  ?CBC  ?WBC 4.0 - 10.5 K/uL 13.3   14.2     ?Hemoglobin 13.0 - 17.0 g/dL 15.8   16.8   16.0    ?Hematocrit 39.0 - 52.0 % 46.3   47.8   47.0    ?Platelets 150 - 400 K/uL 171   210     ? ? ? ?Estimated Creatinine Clearance: 68.4 mL/min (by C-G formula based on SCr of 0.85 mg/dL). ? ?Antibiotics Given (last 72 hours)   ? ? Date/Time Action Medication Dose Rate  ? 09/28/21 1522 New Bag/Given  ? metroNIDAZOLE (FLAGYL) IVPB 500 mg 500 mg 100 mL/hr  ? 09/28/21 1600 New Bag/Given  ? ceFEPIme (MAXIPIME) 2 g in sodium chloride 0.9 % 100 mL IVPB 2 g 200 mL/hr  ? 09/28/21 1635 New Bag/Given  ? vancomycin (VANCOREADY) IVPB 1500 mg/300 mL 1,500 mg 150 mL/hr  ? 09/29/21 0222 New Bag/Given  ? metroNIDAZOLE (FLAGYL) IVPB 500 mg 500 mg 100 mL/hr  ? 09/29/21 0408 New Bag/Given  ? ceFEPIme (MAXIPIME) 2 g in sodium chloride 0.9 % 100 mL IVPB 2 g 200 mL/hr  ? 09/29/21 0533 New Bag/Given  ? vancomycin (VANCOREADY) IVPB 750 mg/150 mL 750 mg 150 mL/hr  ? 09/29/21 1149 New Bag/Given  ? ceFEPIme (MAXIPIME) 2 g in sodium chloride 0.9 % 100 mL IVPB 2 g 200 mL/hr  ? ?  ? ? ?Antimicrobials this admission: ?Acyclovir 5/15 >> ?Amp 5/15 >> ?CTX 5/15 >> ?Cefepime 09/28/2021  >> 5/15 ?vancomycin 09/28/2021  >>  ? ? ?Microbiology results: ?09/28/2021  BCx:  ngtd ?09/28/2021  UCx: sent ?CSF Cx: pending ?09/28/2021  MRSA PCR: neg ? ?Thank you for allowing pharmacy to be a part of this patient?s care. ? ?Margot Ables, PharmD ?Clinical Pharmacist ?09/29/2021 3:23 PM ? ? ? ? ?

## 2021-09-29 NOTE — Progress Notes (Signed)
ANTICOAGULATION CONSULT NOTE - Initial Consult ? ?Pharmacy Consult for heparin ?Indication: pulmonary embolus ? ?No Known Allergies ? ?Patient Measurements: ?Height: '5\' 10"'$  (177.8 cm) ?Weight: 72.5 kg (159 lb 14.4 oz) ?IBW/kg (Calculated) : 73 ? ?Vital Signs: ?Temp: 100.3 ?F (37.9 ?C) (05/14 1942) ?Temp Source: Rectal (05/14 1942) ?BP: 121/84 (05/14 2300) ?Pulse Rate: 129 (05/14 2300) ? ?Labs: ?Recent Labs  ?  09/28/21 ?1518  ?HGB 16.8  ?HCT 47.8  ?PLT 210  ?APTT 32  ?LABPROT 14.3  ?INR 1.1  ?CREATININE 1.00  ? ? ?Estimated Creatinine Clearance: 59.4 mL/min (by C-G formula based on SCr of 1 mg/dL). ? ? ?Medical History: ?Past Medical History:  ?Diagnosis Date  ? Diabetes ([place])   ? High cholesterol   ? Hypertension   ? Kidney stone   ? ? ?Medications:  ?Medications Prior to Admission  ?Medication Sig Dispense Refill Last Dose  ? acetaminophen (TYLENOL) 325 MG tablet Take 2 tablets (650 mg total) by mouth every 4 (four) hours as needed for mild pain (or temp > 37.5 C (99.5 F)). 12 tablet 0 Past Week  ? aspirin EC 81 MG EC tablet Take 1 tablet (81 mg total) by mouth daily with breakfast. Please take Aspirin 81 mg daily along with Plavix 75 mg daily for 21 days then after that STOP the Plavix  and continue ONLY Aspirin 81 mg daily indefinitely--for  stroke Prevention (Patient taking differently: Take 81 mg by mouth daily with breakfast.) 30 tablet 11 09/27/2021  ? glipiZIDE (GLUCOTROL) 10 MG tablet Take 1 tablet (10 mg total) by mouth 2 (two) times daily before a meal. 180 tablet 4 09/27/2021  ? indomethacin (INDOCIN) 50 MG capsule Take 50 mg by mouth 2 (two) times daily.   09/27/2021  ? lisinopril-hydrochlorothiazide (PRINZIDE,ZESTORETIC) 20-25 MG per tablet Take 1 tablet by mouth daily.   09/27/2021  ? metFORMIN (GLUCOPHAGE) 1000 MG tablet Take 1 tablet (1,000 mg total) by mouth 2 (two) times daily with a meal. 180 tablet 4 09/27/2021  ? clopidogrel (PLAVIX) 75 MG tablet Take 1 tablet (75 mg total) by mouth daily. Please  take Aspirin 81 mg daily along with Plavix 75 mg daily for 21 days then after that STOP the Plavix  and continue ONLY Aspirin 81 mg daily indefinitely--for  stroke Prevention (Patient not taking: Reported on 09/28/2021) 30 tablet 0 Completed Course  ? ?Scheduled:  ? aspirin EC  81 mg Oral Q breakfast  ? enoxaparin (LOVENOX) injection  40 mg Subcutaneous Q24H  ? glipiZIDE  10 mg Oral BID AC  ? lisinopril  20 mg Oral Daily  ? And  ? hydrochlorothiazide  25 mg Oral Daily  ? insulin aspart  0-20 Units Subcutaneous TID AC & HS  ? ?Infusions:  ? sodium chloride 100 mL/hr at 09/28/21 2215  ? ceFEPime (MAXIPIME) IV    ? lactated ringers Stopped (09/29/21 0019)  ? metronidazole    ? vancomycin    ? ? ?Assessment: ?81yo male was found unresponsive by family >> started on ABX for sepsis, now w/ CT revealing mild PE in RUL >> to begin heparin. ? ?Goal of Therapy:  ?Heparin level 0.3-0.7 units/ml ?Monitor platelets by anticoagulation protocol: Yes ?  ?Plan:  ?Rec'd low-dose LMWH at 2300; give small heparin bolus of 2000 units followed by infusion at 1200 units/hr. ?Monitor heparin levels and CBC. ? ?Wynona Neat, PharmD, BCPS  ?09/29/2021,1:23 AM ? ? ?

## 2021-09-29 NOTE — Assessment & Plan Note (Addendum)
-   The patient will be admitted to a stepdown unit bed. ?- We will place him on IV heparin. ?- We will obtain a 2D echo to assess for RV strain as well as bilateral lower extremity venous Doppler to rule out DVT. ?- O2 protocol will be followed. ?- Differential diagnosis would include septic emboli as well. ?

## 2021-09-29 NOTE — Progress Notes (Signed)
ANTICOAGULATION CONSULT NOTE - Initial Consult ? ?Pharmacy Consult for heparin ?Indication: pulmonary embolus ? ?No Known Allergies ? ?Patient Measurements: ?Height: '5\' 10"'$  (177.8 cm) ?Weight: 70.9 kg (156 lb 4.9 oz) ?IBW/kg (Calculated) : 73 ? ?Vital Signs: ?Temp: 97.3 ?F (36.3 ?C) (05/15 0725) ?Temp Source: Axillary (05/15 0725) ?BP: 142/67 (05/15 0945) ?Pulse Rate: 100 (05/15 0945) ? ?Labs: ?Recent Labs  ?  09/28/21 ?1518 09/29/21 ?0400 09/29/21 ?1008  ?HGB 16.8 15.8  --   ?HCT 47.8 46.3  --   ?PLT 210 171  --   ?APTT 32  --   --   ?LABPROT 14.3 15.4*  --   ?INR 1.1 1.2  --   ?HEPARINUNFRC  --   --  0.79*  ?CREATININE 1.00 0.85  --   ? ? ? ?Estimated Creatinine Clearance: 68.4 mL/min (by C-G formula based on SCr of 0.85 mg/dL). ? ? ?Medical History: ?Past Medical History:  ?Diagnosis Date  ? Diabetes ([place])   ? High cholesterol   ? Hypertension   ? Kidney stone   ? ? ?Medications:  ?Medications Prior to Admission  ?Medication Sig Dispense Refill Last Dose  ? acetaminophen (TYLENOL) 325 MG tablet Take 2 tablets (650 mg total) by mouth every 4 (four) hours as needed for mild pain (or temp > 37.5 C (99.5 F)). 12 tablet 0 Past Week  ? aspirin EC 81 MG EC tablet Take 1 tablet (81 mg total) by mouth daily with breakfast. Please take Aspirin 81 mg daily along with Plavix 75 mg daily for 21 days then after that STOP the Plavix  and continue ONLY Aspirin 81 mg daily indefinitely--for  stroke Prevention (Patient taking differently: Take 81 mg by mouth daily with breakfast.) 30 tablet 11 09/27/2021  ? glipiZIDE (GLUCOTROL) 10 MG tablet Take 1 tablet (10 mg total) by mouth 2 (two) times daily before a meal. 180 tablet 4 09/27/2021  ? indomethacin (INDOCIN) 50 MG capsule Take 50 mg by mouth 2 (two) times daily.   09/27/2021  ? lisinopril-hydrochlorothiazide (PRINZIDE,ZESTORETIC) 20-25 MG per tablet Take 1 tablet by mouth daily.   09/27/2021  ? metFORMIN (GLUCOPHAGE) 1000 MG tablet Take 1 tablet (1,000 mg total) by mouth 2 (two)  times daily with a meal. 180 tablet 4 09/27/2021  ? clopidogrel (PLAVIX) 75 MG tablet Take 1 tablet (75 mg total) by mouth daily. Please take Aspirin 81 mg daily along with Plavix 75 mg daily for 21 days then after that STOP the Plavix  and continue ONLY Aspirin 81 mg daily indefinitely--for  stroke Prevention (Patient not taking: Reported on 09/28/2021) 30 tablet 0 Completed Course  ? ?Scheduled:  ? acetaminophen  650 mg Oral Once  ? aspirin EC  81 mg Oral Q breakfast  ? Chlorhexidine Gluconate Cloth  6 each Topical Daily  ? glipiZIDE  10 mg Oral BID AC  ? lisinopril  20 mg Oral Daily  ? And  ? hydrochlorothiazide  25 mg Oral Daily  ? insulin aspart  0-20 Units Subcutaneous TID AC & HS  ? ?Infusions:  ? sodium chloride 100 mL/hr at 09/28/21 2215  ? ceFEPime (MAXIPIME) IV Stopped (09/29/21 9935)  ? heparin 1,200 Units/hr (09/29/21 0900)  ? lactated ringers Stopped (09/29/21 0019)  ? metronidazole Stopped (09/29/21 0323)  ? vancomycin Stopped (09/29/21 7017)  ? ? ?Assessment: ?81yo male was found unresponsive by family >> started on ABX for sepsis, now w/ CT revealing mild PE in RUL >> to begin heparin. ? ?HL 0.79- supratherapeutic ? ?  Goal of Therapy:  ?Heparin level 0.3-0.7 units/ml ?Monitor platelets by anticoagulation protocol: Yes ?  ?Plan:  ?Decrease heparin infusion to 1100 units/hr. ?Monitor heparin levels and CBC. ? ?Margot Ables, PharmD ?Clinical Pharmacist ?09/29/2021 10:47 AM ? ? ? ?

## 2021-09-29 NOTE — Assessment & Plan Note (Addendum)
-   We will continue broad-spectrum antibiotic therapy. ?- The patient meets severe sepsis criteria given elevated lactic acid and may be in septic shock. ?- We will continue hydration with IV normal saline. ?- We will follow blood and urine cultures. ?- This is clearly contributing to his altered mental status and encephalopathy. ?

## 2021-09-29 NOTE — Progress Notes (Signed)
ANTICOAGULATION CONSULT NOTE - Initial Consult ? ?Pharmacy Consult for heparin ?Indication: pulmonary embolus ? ?No Known Allergies ? ?Patient Measurements: ?Height: '5\' 10"'$  (177.8 cm) ?Weight: 70.9 kg (156 lb 4.9 oz) ?IBW/kg (Calculated) : 73 ? ?Vital Signs: ?Temp: 99 ?F (37.2 ?C) (05/15 1546) ?Temp Source: Axillary (05/15 1546) ?BP: 143/70 (05/15 1900) ?Pulse Rate: 99 (05/15 1900) ? ?Labs: ?Recent Labs  ?  09/28/21 ?1518 09/29/21 ?0400 09/29/21 ?1008 09/29/21 ?2012  ?HGB 16.8 15.8  --   --   ?HCT 47.8 46.3  --   --   ?PLT 210 171  --   --   ?APTT 32  --   --   --   ?LABPROT 14.3 15.4*  --   --   ?INR 1.1 1.2  --   --   ?HEPARINUNFRC  --   --  0.79* 0.37  ?CREATININE 1.00 0.85  --   --   ? ? ? ?Estimated Creatinine Clearance: 68.4 mL/min (by C-G formula based on SCr of 0.85 mg/dL). ? ? ?Medical History: ?Past Medical History:  ?Diagnosis Date  ? Diabetes ([place])   ? High cholesterol   ? Hypertension   ? Kidney stone   ? ? ?Medications:  ?Medications Prior to Admission  ?Medication Sig Dispense Refill Last Dose  ? acetaminophen (TYLENOL) 325 MG tablet Take 2 tablets (650 mg total) by mouth every 4 (four) hours as needed for mild pain (or temp > 37.5 C (99.5 F)). 12 tablet 0 Past Week  ? aspirin EC 81 MG EC tablet Take 1 tablet (81 mg total) by mouth daily with breakfast. Please take Aspirin 81 mg daily along with Plavix 75 mg daily for 21 days then after that STOP the Plavix  and continue ONLY Aspirin 81 mg daily indefinitely--for  stroke Prevention (Patient taking differently: Take 81 mg by mouth daily with breakfast.) 30 tablet 11 09/27/2021  ? glipiZIDE (GLUCOTROL) 10 MG tablet Take 1 tablet (10 mg total) by mouth 2 (two) times daily before a meal. 180 tablet 4 09/27/2021  ? indomethacin (INDOCIN) 50 MG capsule Take 50 mg by mouth 2 (two) times daily.   09/27/2021  ? lisinopril-hydrochlorothiazide (PRINZIDE,ZESTORETIC) 20-25 MG per tablet Take 1 tablet by mouth daily.   09/27/2021  ? metFORMIN (GLUCOPHAGE) 1000 MG  tablet Take 1 tablet (1,000 mg total) by mouth 2 (two) times daily with a meal. 180 tablet 4 09/27/2021  ? clopidogrel (PLAVIX) 75 MG tablet Take 1 tablet (75 mg total) by mouth daily. Please take Aspirin 81 mg daily along with Plavix 75 mg daily for 21 days then after that STOP the Plavix  and continue ONLY Aspirin 81 mg daily indefinitely--for  stroke Prevention (Patient not taking: Reported on 09/28/2021) 30 tablet 0 Completed Course  ? ?Scheduled:  ? acetaminophen  650 mg Oral Once  ? Chlorhexidine Gluconate Cloth  6 each Topical Daily  ? insulin aspart  0-20 Units Subcutaneous Q4H  ? ?Infusions:  ? acyclovir 264.2 mL/hr at 09/29/21 1833  ? ampicillin (OMNIPEN) IV Stopped (09/29/21 1702)  ? cefTRIAXone (ROCEPHIN)  IV Stopped (09/29/21 1553)  ? heparin Stopped (09/29/21 1744)  ? vancomycin 750 mg (09/29/21 2100)  ? ? ?Assessment: ?81yo male was found unresponsive by family >> started on ABX for sepsis, now w/ CT revealing mild PE in RUL >> to begin heparin. ? ?Heparin therapeutic tonight. Plan to stop at 0500 tomorrow for LP procedure.  ? ?Goal of Therapy:  ?Heparin level 0.3-0.7 units/ml ?Monitor platelets by anticoagulation  protocol: Yes ?  ?Plan:  ?Continue heparin infusion 1100 units/hr unitl 0500 ?F/u resume heparin after LP ? ?Onnie Boer, PharmD, BCIDP, AAHIVP, CPP ?Infectious Disease Pharmacist ?09/29/2021 9:02 PM ? ? ? ? ? ?

## 2021-09-29 NOTE — Assessment & Plan Note (Signed)
-  We will replace potassium and check magnesium level. 

## 2021-09-29 NOTE — Assessment & Plan Note (Addendum)
-   This is clearly secondary to acute PE. ?- O2 protocol will be followed. ?- This is clearly contributing to his altered mental status and encephalopathy. ? ?

## 2021-09-29 NOTE — Progress Notes (Signed)
?PROGRESS NOTE ? ? ? ?Calvin Mills  FGH:[id number] DOB: 04/13/1941 DOA: 09/28/2021 ?PCP: Redmond School, MD  ? ? ?Brief Narrative:  ?81 year old male who was in his usual state of health on 5/13, was found by his son with severe altered mental status/unresponsive on 5/14.  He was brought to the ER where he was noted to be febrile and a temperature of 103 ?F.  There was no clear source of infection.  Started on intravenous antibiotics.  Further infectious work-up is underway.  Also noted to have right-sided pulmonary emboli, started on anticoagulation. ? ? ?Assessment & Plan: ?  ?Principal Problem: ?  Sepsis ([place]) ?Active Problems: ?  Acute pulmonary embolus ([place]) ?  Acute respiratory failure with hypoxia ([place]) ?  Uncontrolled type 2 diabetes mellitus with hyperglycemia, with long-term current use of insulin ([place]) ?  Atrial fibrillation with rapid ventricular response ([place]) ?  Essential hypertension ?  Hypokalemia ? ? ?Sepsis ?-Etiology is unclear, although concern for CNS infection ?-Lactic acid noted to be elevated on admission, improved with IV fluids ?-Noted to be febrile, tachycardic, tachypneic on admission ?-Blood cultures have been sent ?-He has been started on intravenous antibiotics ?-Urinalysis is not convincing for infection ?-Chest x-ray/CT did not underlying pneumonia ?-Blood puncture attempted in the emergency room, unsuccessful.  Discussed with radiology/Dr. Thornton Papas will attempt LP with fluoroscopy in a.m. ?-Discussed with infectious disease, Dr. Juleen China will also see patient in a.m. ? ?Acute encephalopathy ?-Related to sepsis, fever 103F on admission ?-MRI brain did not show any acute findings ?-EEG did not show any epileptiform discharges ?-ABG did not show any hypercarbia ?-Ammonia only minimally elevated ?-Plans for lumbar puncture in a.m. ?-No recent changes in medication ?-He is not on any sedatives at home, he is not on SSRIs ?-Change in mental status was relatively sudden, was last in usual state  of health 5/13, and by 5/14 he was unresponsive ? ?Acute pulmonary embolus ?-Have right-sided PE on CT imaging ?-Started on heparin infusion ?-Echo without evidence of right-sided heart strain ?-Venous Dopplers of lower extremities negative for DVT ?-We will eventually transition to DOAC if condition stabilizes ? ?Atrial fibrillation ?-Continue beta-blockers for rate control ?-Currently on anticoagulation with heparin infusion ?-Echocardiogram shows preserved ejection fraction ? ?Diabetes ?-Continue on sliding scale insulin ?-Blood sugars elevated on admission, he was only taking oral agents prior to admission ?-Follow-up A1c ? ?Hypertension ?-Chronically on lisinopril/hydrochlorothiazide ?-Holding now since he is n.p.o. ?-Use labetalol IV as needed ? ? ?DVT prophylaxis:   Heparin infusion ? ?Code Status: DNR ?Family Communication: Updated son at the bedside ?Disposition Plan: Status is: Inpatient ?Remains inpatient appropriate because: Remains altered mental status needing further work-up ? ? ? ? ?Consultants:  ? ? ?Procedures:  ? ? ?Antimicrobials:  ?Vancomycin ?Ampicillin ?Ceftriaxone ?Acyclovir ? ? ?Subjective: ?Does not respond to voice, does not open eyes, has loud snoring respirations ? ?Objective: ?Vitals:  ? 09/29/21 1700 09/29/21 1730 09/29/21 1840 09/29/21 1900  ?BP: 132/63 132/68 (!) 145/73 (!) 143/70  ?Pulse: 98 97 95 99  ?Resp: 20 (!) '21 20 16  '$ ?Temp:      ?TempSrc:      ?SpO2: 97% 95% 99% 99%  ?Weight:      ?Height:      ? ? ?Intake/Output Summary (Last 24 hours) at 09/29/2021 1950 ?Last data filed at 09/29/2021 1800 ?Gross per 24 hour  ?Intake 3361.87 ml  ?Output 450 ml  ?Net 2911.87 ml  ? ?Filed Weights  ? 09/28/21 1531 09/29/21 0500  ?  Weight: 72.5 kg 70.9 kg  ? ? ?Examination: ? ?General exam: Unresponsive ?Respiratory system: Clear to auscultation.  Increased respiratory effort, snoring respirations ?Cardiovascular system: S1 & S2 heard, RRR. No JVD, murmurs, rubs, gallops or clicks.   ?Gastrointestinal system: Abdomen is nondistended, soft and nontender. No organomegaly or masses felt. Normal bowel sounds heard. ?Central nervous system: No focal neurological deficits.  Pupils are equal and reactive bilaterally ?Extremities: No edema bilaterally ?Skin: No rashes, lesions or ulcers ?Psychiatry: Unresponsive ? ? ? ?Data Reviewed: I have personally reviewed following labs and imaging studies ? ?CBC: ?Recent Labs  ?Lab 09/28/21 ?1518 09/29/21 ?0400  ?WBC 14.2* 13.3*  ?NEUTROABS 12.4*  --   ?HGB 16.8 15.8  ?HCT 47.8 46.3  ?MCV 93.0 95.7  ?PLT 210 171  ? ?Basic Metabolic Panel: ?Recent Labs  ?Lab 09/28/21 ?1518 09/29/21 ?0400  ?NA 139 145  ?K 3.3* 3.4*  ?CL 102 109  ?CO2 22 27  ?GLUCOSE 471* 399*  ?BUN 21 24*  ?CREATININE 1.00 0.85  ?CALCIUM 9.1 8.7*  ? ?GFR: ?Estimated Creatinine Clearance: 68.4 mL/min (by C-G formula based on SCr of 0.85 mg/dL). ?Liver Function Tests: ?Recent Labs  ?Lab 09/28/21 ?1518  ?AST 44*  ?ALT 29  ?ALKPHOS 84  ?BILITOT 1.6*  ?PROT 8.1  ?ALBUMIN 4.5  ? ?No results for input(s): LIPASE, AMYLASE in the last 168 hours. ?Recent Labs  ?Lab 09/29/21 ?1728  ?AMMONIA 47*  ? ?Coagulation Profile: ?Recent Labs  ?Lab 09/28/21 ?1518 09/29/21 ?0400  ?INR 1.1 1.2  ? ?Cardiac Enzymes: ?No results for input(s): CKTOTAL, CKMB, CKMBINDEX, TROPONINI in the last 168 hours. ?BNP (last 3 results) ?No results for input(s): PROBNP in the last 8760 hours. ?HbA1C: ?No results for input(s): HGBA1C in the last 72 hours. ?CBG: ?Recent Labs  ?Lab 09/29/21 ?0002 09/29/21 ?0155 09/29/21 ?5643 09/29/21 ?1210 09/29/21 ?1544  ?GLUCAP 359* 355* 325* 340* 225*  ? ?Lipid Profile: ?No results for input(s): CHOL, HDL, LDLCALC, TRIG, CHOLHDL, LDLDIRECT in the last 72 hours. ?Thyroid Function Tests: ?No results for input(s): TSH, T4TOTAL, FREET4, T3FREE, THYROIDAB in the last 72 hours. ?Anemia Panel: ?No results for input(s): VITAMINB12, FOLATE, FERRITIN, TIBC, IRON, RETICCTPCT in the last 72 hours. ?Sepsis  Labs: ?Recent Labs  ?Lab 09/28/21 ?1518 09/28/21 ?1848 09/29/21 ?1728  ?PROCALCITON  --  <0.10  --   ?LATICACIDVEN 4.9* 3.2* 1.5  ? ? ?Recent Results (from the past 240 hour(s))  ?Resp Panel by RT-PCR (Flu A&B, Covid) Nasopharyngeal Swab     Status: None  ? Collection Time: 09/28/21  3:02 PM  ? Specimen: Nasopharyngeal Swab; Nasopharyngeal(NP) swabs in vial transport medium  ?Result Value Ref Range Status  ? SARS Coronavirus 2 by RT PCR NEGATIVE NEGATIVE Final  ?  Comment: (NOTE) ?SARS-CoV-2 target nucleic acids are NOT DETECTED. ? ?The SARS-CoV-2 RNA is generally detectable in upper respiratory ?specimens during the acute phase of infection. The lowest ?concentration of SARS-CoV-2 viral copies this assay can detect is ?138 copies/mL. A negative result does not preclude SARS-Cov-2 ?infection and should not be used as the sole basis for treatment or ?other patient management decisions. A negative result may occur with  ?improper specimen collection/handling, submission of specimen other ?than nasopharyngeal swab, presence of viral mutation(s) within the ?areas targeted by this assay, and inadequate number of viral ?copies(<138 copies/mL). A negative result must be combined with ?clinical observations, patient history, and epidemiological ?information. The expected result is Negative. ? ?Fact Sheet for Patients:  ?EntrepreneurPulse.com.au ? ?Fact Sheet for Healthcare Providers:  ?  IncredibleEmployment.be ? ?This test is no t yet approved or cleared by the [place] FDA and  ?has been authorized for detection and/or diagnosis of SARS-CoV-2 by ?FDA under an Emergency Use Authorization (EUA). This EUA will remain  ?in effect (meaning this test can be used) for the duration of the ?COVID-19 declaration under Section 564(b)(1) of the Act, 21 ?U.S.C.section 360bbb-3(b)(1), unless the authorization is terminated  ?or revoked sooner.  ? ? ?  ? Influenza A by PCR NEGATIVE NEGATIVE Final  ?  Influenza B by PCR NEGATIVE NEGATIVE Final  ?  Comment: (NOTE) ?The Xpert Xpress SARS-CoV-2/FLU/RSV plus assay is intended as an aid ?in the diagnosis of influenza from Nasopharyngeal swab specimens and ?should not be used a

## 2021-09-29 NOTE — Assessment & Plan Note (Signed)
-   The patient was given Tylenol and his fever is contributing to his tachycardia as well as volume depletion. ?- Acute PE is also contributing to his rapid ventricular response. ?- He will be placed on IV heparin as mentioned above. ?- We will utilize as needed IV Cardizem as needed. ?

## 2021-09-29 NOTE — TOC Progression Note (Signed)
Transition of Care (TOC) - Progression Note  ? ? ?Patient Details  ?Name: Calvin Mills ?MRN: [id number] ?Date of Birth: 1941-02-25 ? ?Transition of Care (TOC) CM/SW Contact  ?Salome Arnt, LCSW ?Phone Number: ?09/29/2021, 10:05 AM ? ?Clinical Narrative:   ?Transition of Care (TOC) Screening Note ? ? ?Patient Details  ?Name: Calvin Mills ?Date of Birth: 01-Jul-1940 ? ? ?Transition of Care (TOC) CM/SW Contact:    ?Salome Arnt, LCSW ?Phone Number: ?09/29/2021, 10:05 AM ? ? ? ?Transition of Care Department [facility]) has reviewed patient and no TOC needs have been identified at this time. We will continue to monitor patient advancement through interdisciplinary progression rounds. If new patient transition needs arise, please place a TOC consult. ?   ? ? ? ?  ?  ? ?Expected Discharge Plan and Services ?  ?  ?  ?  ?  ?                ?  ?  ?  ?  ?  ?  ?  ?  ?  ?  ? ? ?Social Determinants of Health (SDOH) Interventions ?  ? ?Readmission Risk Interventions ?   ? View : No data to display.  ?  ?  ?  ? ? ?

## 2021-09-29 NOTE — Progress Notes (Signed)
EEG complete - results pending 

## 2021-09-29 NOTE — Assessment & Plan Note (Signed)
-   We will continue his antihypertensives. ?- Until he is alert enough to take p.o. medications we will place him on as needed IV hydralazine and labetalol. ?

## 2021-09-29 NOTE — Progress Notes (Signed)
Inpatient Diabetes Program Recommendations ? ?AACE/ADA: New Consensus Statement on Inpatient Glycemic Control (2015) ? ?Target Ranges:  Prepandial:   less than 140 mg/dL ?     Peak postprandial:   less than 180 mg/dL (1-2 hours) ?     Critically ill patients:  140 - 180 mg/dL  ? ?Lab Results  ?Component Value Date  ? GLUCAP 325 (H) 09/29/2021  ? HGBA1C 12.5 (H) 05/29/2021  ? ? ?Review of Glycemic Control ? ?Diabetes history: DM2 ?Outpatient Diabetes medications: Glucotrol 10 mg bid, Metformin 1 gm bid ?Current orders for Inpatient glycemic control: Glucotrol 10 mg bid, Novolog 0-20 units qid ? ?Inpatient Diabetes Program Recommendations:   ?Patient is currently NPO. ?-D/C Glucotrol ?-Decrease Novolog to 0-9 units q 4 hrs. While NPO. ?Add basal insulin as needed. ?Will follow while in the hospital. ?Secure chat sent to Dr. Roderic Palau. ? ?Thank you, ?Nani Gasser Lisanne Ponce, RN, MSN, CDE  ?Diabetes Coordinator ?Inpatient Glycemic Control Team ?Team Pager [phone] (8am-5pm) ?09/29/2021 10:51 AM ? ? ? ? ?

## 2021-09-29 NOTE — Progress Notes (Signed)
?  Echocardiogram ?2D Echocardiogram has been performed. ? Calvin Mills ?09/29/2021, 11:54 AM ?

## 2021-09-29 NOTE — Progress Notes (Signed)
Patient responsive only to pain upon arrival from ED. Temperature 101. 7, ice packs applied. Patient also hypertensive and tachycardic with elevated blood glucose and D-Dimer 19.12. MD aware of all. Orders placed and followed. See MAR. ?

## 2021-09-29 NOTE — Progress Notes (Signed)
Patient's son at bedside and brought patient's [facility]) paperwork.  Patient's son confirmed code status is DNR.   HCPOA paperwork placed in patient's chart.  ?

## 2021-09-29 NOTE — Procedures (Signed)
Patient Name: Calvin Mills  ?MRN: [id number]  ?Epilepsy Attending: Lora Havens  ?Referring Physician/Provider: Kathie Dike, MD ?Date: 09/29/2021 ?Duration: 22.43 mins ? ?Patient history: 81yo M with ams. EEG to evaluate for seizure. ? ?Level of alertness: lethargic  ? ?AEDs during EEG study: None ? ?Technical aspects: This EEG study was done with scalp electrodes positioned according to the 10-20 International system of electrode placement. Electrical activity was acquired at a sampling rate of '500Hz'$  and reviewed with a high frequency filter of '70Hz'$  and a low frequency filter of '1Hz'$ . EEG data were recorded continuously and digitally stored.  ? ?Description:No posterior dominant rhythm was seen. EEG showed continuous generalized and lateralized right hemisphere 3 to 6 Hz theta-delta slowing. Hyperventilation and photic stimulation were not performed.    ? ?ABNORMALITY ?- Continuous slow, generalized and lateralized right hemisphere ? ?IMPRESSION: ?This study is suggestive of cortical dysfunction arising from right hemisphere likely secondary to underlying structural abnormality. Additionally there is moderate to severe diffuse encephalopathy, nonspecific etiology. No seizures or epileptiform discharges were seen throughout the recording. ? ?Lora Havens  ? ?

## 2021-09-30 ENCOUNTER — Inpatient Hospital Stay (HOSPITAL_COMMUNITY): Payer: Medicare HMO

## 2021-09-30 ENCOUNTER — Encounter (HOSPITAL_COMMUNITY): Payer: Self-pay | Admitting: Family Medicine

## 2021-09-30 DIAGNOSIS — I10 Essential (primary) hypertension: Secondary | ICD-10-CM | POA: Diagnosis not present

## 2021-09-30 DIAGNOSIS — I4891 Unspecified atrial fibrillation: Secondary | ICD-10-CM | POA: Diagnosis not present

## 2021-09-30 DIAGNOSIS — R6520 Severe sepsis without septic shock: Secondary | ICD-10-CM | POA: Diagnosis not present

## 2021-09-30 DIAGNOSIS — A419 Sepsis, unspecified organism: Secondary | ICD-10-CM | POA: Diagnosis not present

## 2021-09-30 DIAGNOSIS — E1165 Type 2 diabetes mellitus with hyperglycemia: Secondary | ICD-10-CM | POA: Diagnosis not present

## 2021-09-30 DIAGNOSIS — I2690 Septic pulmonary embolism without acute cor pulmonale: Secondary | ICD-10-CM | POA: Diagnosis not present

## 2021-09-30 LAB — GLUCOSE, CAPILLARY
Glucose-Capillary: 129 mg/dL — ABNORMAL HIGH (ref 70–99)
Glucose-Capillary: 133 mg/dL — ABNORMAL HIGH (ref 70–99)
Glucose-Capillary: 139 mg/dL — ABNORMAL HIGH (ref 70–99)
Glucose-Capillary: 146 mg/dL — ABNORMAL HIGH (ref 70–99)
Glucose-Capillary: 162 mg/dL — ABNORMAL HIGH (ref 70–99)
Glucose-Capillary: 169 mg/dL — ABNORMAL HIGH (ref 70–99)
Glucose-Capillary: 187 mg/dL — ABNORMAL HIGH (ref 70–99)

## 2021-09-30 LAB — COMPREHENSIVE METABOLIC PANEL
ALT: 25 U/L (ref 0–44)
AST: 32 U/L (ref 15–41)
Albumin: 3.2 g/dL — ABNORMAL LOW (ref 3.5–5.0)
Alkaline Phosphatase: 53 U/L (ref 38–126)
Anion gap: 5 (ref 5–15)
BUN: 30 mg/dL — ABNORMAL HIGH (ref 8–23)
CO2: 26 mmol/L (ref 22–32)
Calcium: 8.4 mg/dL — ABNORMAL LOW (ref 8.9–10.3)
Chloride: 115 mmol/L — ABNORMAL HIGH (ref 98–111)
Creatinine, Ser: 0.72 mg/dL (ref 0.61–1.24)
GFR, Estimated: >60 mL/min (ref >60)
Glucose, Bld: 179 mg/dL — ABNORMAL HIGH (ref 70–99)
Potassium: 2.8 mmol/L — ABNORMAL LOW (ref 3.5–5.1)
Sodium: 146 mmol/L — ABNORMAL HIGH (ref 135–145)
Total Bilirubin: 1.4 mg/dL — ABNORMAL HIGH (ref 0.3–1.2)
Total Protein: 6.1 g/dL — ABNORMAL LOW (ref 6.5–8.1)

## 2021-09-30 LAB — CSF CELL COUNT WITH DIFFERENTIAL
RBC Count, CSF: 4 /mm3 — ABNORMAL HIGH
Tube #: 3
WBC, CSF: 1 /mm3 (ref 0–5)

## 2021-09-30 LAB — CBC
HCT: 43.1 % (ref 39.0–52.0)
Hemoglobin: 14.6 g/dL (ref 13.0–17.0)
MCH: 32.8 pg (ref 26.0–34.0)
MCHC: 33.9 g/dL (ref 30.0–36.0)
MCV: 96.9 fL (ref 80.0–100.0)
Platelets: 143 10*3/uL — ABNORMAL LOW (ref 150–400)
RBC: 4.45 MIL/uL (ref 4.22–5.81)
RDW: 14.1 % (ref 11.5–15.5)
WBC: 13 10*3/uL — ABNORMAL HIGH (ref 4.0–10.5)
nRBC: 0 % (ref 0.0–0.2)

## 2021-09-30 LAB — MAGNESIUM: Magnesium: 1.7 mg/dL (ref 1.7–2.4)

## 2021-09-30 LAB — PROTEIN AND GLUCOSE, CSF
Glucose, CSF: 154 mg/dL — ABNORMAL HIGH (ref 40–70)
Total  Protein, CSF: 99 mg/dL — ABNORMAL HIGH (ref 15–45)

## 2021-09-30 LAB — URINE CULTURE

## 2021-09-30 LAB — HEPARIN LEVEL (UNFRACTIONATED): Heparin Unfractionated: 0.25 IU/mL — ABNORMAL LOW (ref 0.30–0.70)

## 2021-09-30 LAB — HIV ANTIBODY (ROUTINE TESTING W REFLEX): HIV Screen 4th Generation wRfx: NONREACTIVE

## 2021-09-30 MED ORDER — POTASSIUM CHLORIDE 10 MEQ/100ML IV SOLN
10.0000 meq | INTRAVENOUS | Status: AC
  Administered 2021-09-30 (×4): 10 meq via INTRAVENOUS
  Filled 2021-09-30 (×4): qty 100

## 2021-09-30 MED ORDER — HEPARIN (PORCINE) 25000 UT/250ML-% IV SOLN
1200.0000 [IU]/h | INTRAVENOUS | Status: DC
  Administered 2021-10-01 – 2021-10-02 (×2): 1200 [IU]/h via INTRAVENOUS
  Filled 2021-09-30 (×2): qty 250

## 2021-09-30 MED ORDER — ORAL CARE MOUTH RINSE
15.0000 mL | Freq: Two times a day (BID) | OROMUCOSAL | Status: DC
  Administered 2021-09-30 – 2021-10-02 (×5): 15 mL via OROMUCOSAL

## 2021-09-30 MED ORDER — POTASSIUM CHLORIDE 2 MEQ/ML IV SOLN
INTRAVENOUS | Status: DC
  Filled 2021-09-30 (×5): qty 1000

## 2021-09-30 MED ORDER — CHLORHEXIDINE GLUCONATE 0.12 % MT SOLN
15.0000 mL | Freq: Two times a day (BID) | OROMUCOSAL | Status: DC
  Administered 2021-09-30 – 2021-10-01 (×3): 15 mL via OROMUCOSAL
  Filled 2021-09-30: qty 15

## 2021-09-30 MED ORDER — SODIUM CHLORIDE 0.9 % IV SOLN
2.0000 g | INTRAVENOUS | Status: AC
  Administered 2021-10-01 – 2021-10-03 (×3): 2 g via INTRAVENOUS
  Filled 2021-09-30 (×3): qty 20

## 2021-09-30 NOTE — Procedures (Signed)
Preprocedure Dx: AMS, FUO ?Postprocedure Dx: AMS, FUO ?Procedure:  Fluoroscopically guided lumbar puncture ?Radiologist:  Thornton Papas ?Anesthesia:  5 ml of 1% lidocaine ?Specimen:  11 ml CSF, clear colorless ?EBL:   < 1 ml ?Opening pressure: 9 cm H2O (but measured prone not LLD) ?Complications: None  ?

## 2021-09-30 NOTE — Progress Notes (Signed)
?PROGRESS NOTE ? ? ? ?Calvin Mills  JKK:[id number] DOB: 12/08/1940 DOA: 09/28/2021 ?PCP: Redmond School, MD  ? ? ?Brief Narrative:  ?81 year old male who was in his usual state of health on 5/13, was found by his son with severe altered mental status/unresponsive on 5/14.  He was brought to the ER where he was noted to be febrile and a temperature of 103 ?F.  There was no clear source of infection.  Started on intravenous antibiotics.  Further infectious work-up is underway.  Also noted to have right-sided pulmonary emboli, started on anticoagulation. ? ? ?Assessment & Plan: ?  ?Principal Problem: ?  Sepsis ([place]) ?Active Problems: ?  Acute pulmonary embolus ([place]) ?  Acute respiratory failure with hypoxia ([place]) ?  Uncontrolled type 2 diabetes mellitus with hyperglycemia, with long-term current use of insulin ([place]) ?  Atrial fibrillation with rapid ventricular response ([place]) ?  Essential hypertension ?  Hypokalemia ? ? ?Sepsis ?-Etiology is unclear, although concern for CNS infection ?-Lactic acid noted to be elevated on admission, improved with IV fluids ?-Noted to be febrile, tachycardic, tachypneic on admission ?-Blood cultures have not shown any growth as of yet ?-He has been started on intravenous antibiotics ?-Urinalysis is not convincing for infection ?-Chest x-ray/CT did not underlying pneumonia ?-Infectious disease following ?-Lumbar puncture today with initial results not indicating overwhelming infectious process ?-Continue to follow culture results ? ?Acute encephalopathy ?-Related to sepsis, fever 103F on admission ?-MRI brain did not show any acute findings ?-EEG did not show any epileptiform discharges ?-ABG did not show any hypercarbia ?-Ammonia only minimally elevated ?-No recent changes in medication ?-He is not on any sedatives at home, he is not on SSRIs ?-Change in mental status was relatively sudden, was last in usual state of health 5/13, and by 5/14 he was unresponsive ?-Lumbar puncture done  5/16 with initial studies not terribly convincing of underlying infection ?-Discussed with infectious disease I will narrow antibiotic coverage to Rocephin daily.  We will also continue acyclovir for now pending HSV PCR ? ?Acute pulmonary embolus ?-Have right-sided PE on CT imaging ?-Started on heparin infusion ?-Echo without evidence of right-sided heart strain ?-Venous Dopplers of lower extremities negative for DVT ?-We will eventually transition to DOAC if condition stabilizes ? ?Atrial fibrillation ?-Continue beta-blockers for rate control ?-Currently on anticoagulation with heparin infusion ?-Echocardiogram shows preserved ejection fraction ? ?Diabetes ?-Continue on sliding scale insulin ?-Blood sugars elevated on admission, he was only taking oral agents prior to admission ?-Blood sugars currently stable ?-Follow-up A1c ? ?Hypertension ?-Chronically on lisinopril/hydrochlorothiazide ?-Holding now since he is n.p.o. ?-Use labetalol IV as needed ? ?Hypokalemia ?-Replace ? ? ?DVT prophylaxis:   Heparin infusion ? ?Code Status: DNR ?Family Communication: Updated son at the bedside ?Disposition Plan: Status is: Inpatient ?Remains inpatient appropriate because: Remains altered mental status needing further work-up ? ? ? ? ?Consultants:  ?Infectious disease ? ?Procedures:  ? ? ?Antimicrobials:  ?Vancomycin ?Ampicillin ?Ceftriaxone ?Acyclovir ? ? ?Subjective: ?He opens eyes to voice, but does not answer questions or follow commands.  Does not make eye contact. ? ?Objective: ?Vitals:  ? 09/30/21 1530 09/30/21 1601 09/30/21 1700 09/30/21 1800  ?BP: 131/69 (!) 150/73 (!) 162/75 135/76  ?Pulse: 96 95 (!) 101 (!) 107  ?Resp: '18 20 18 '$ (!) 31  ?Temp:  (!) 97.2 ?F (36.2 ?C)    ?TempSrc:  Axillary    ?SpO2: 95% 94% 97% 96%  ?Weight:      ?Height:      ? ? ?  Intake/Output Summary (Last 24 hours) at 09/30/2021 1921 ?Last data filed at 09/30/2021 1800 ?Gross per 24 hour  ?Intake 3071.04 ml  ?Output 2900 ml  ?Net 171.04 ml  ? ?Filed  Weights  ? 09/28/21 1531 09/29/21 0500 09/30/21 0408  ?Weight: 72.5 kg 70.9 kg 74 kg  ? ? ?Examination: ? ?General exam: Opens eyes, but does not answer questions, follow commands ?Respiratory system: Clear to auscultation. Respiratory effort normal. ?Cardiovascular system:RRR. No murmurs, rubs, gallops. ?Gastrointestinal system: Abdomen is nondistended, soft and nontender. No organomegaly or masses felt. Normal bowel sounds heard. ?Central nervous system: No focal neurological deficits. ?Extremities: 1+ edema b/l ?Skin: No rashes, lesions or ulcers ?Psychiatry: Unresponsive ? ? ? ? ?Data Reviewed: I have personally reviewed following labs and imaging studies ? ?CBC: ?Recent Labs  ?Lab 09/28/21 ?1518 09/29/21 ?0400 09/30/21 ?1448  ?WBC 14.2* 13.3* 13.0*  ?NEUTROABS 12.4*  --   --   ?HGB 16.8 15.8 14.6  ?HCT 47.8 46.3 43.1  ?MCV 93.0 95.7 96.9  ?PLT 210 171 143*  ? ?Basic Metabolic Panel: ?Recent Labs  ?Lab 09/28/21 ?1518 09/29/21 ?0400 09/30/21 ?1856  ?NA 139 145 146*  ?K 3.3* 3.4* 2.8*  ?CL 102 109 115*  ?CO2 '22 27 26  '$ ?GLUCOSE 471* 399* 179*  ?BUN 21 24* 30*  ?CREATININE 1.00 0.85 0.72  ?CALCIUM 9.1 8.7* 8.4*  ?MG  --   --  1.7  ? ?GFR: ?Estimated Creatinine Clearance: 74.8 mL/min (by C-G formula based on SCr of 0.72 mg/dL). ?Liver Function Tests: ?Recent Labs  ?Lab 09/28/21 ?1518 09/30/21 ?3149  ?AST 44* 32  ?ALT 29 25  ?ALKPHOS 84 53  ?BILITOT 1.6* 1.4*  ?PROT 8.1 6.1*  ?ALBUMIN 4.5 3.2*  ? ?No results for input(s): LIPASE, AMYLASE in the last 168 hours. ?Recent Labs  ?Lab 09/29/21 ?1728  ?AMMONIA 47*  ? ?Coagulation Profile: ?Recent Labs  ?Lab 09/28/21 ?1518 09/29/21 ?0400  ?INR 1.1 1.2  ? ?Cardiac Enzymes: ?No results for input(s): CKTOTAL, CKMB, CKMBINDEX, TROPONINI in the last 168 hours. ?BNP (last 3 results) ?No results for input(s): PROBNP in the last 8760 hours. ?HbA1C: ?No results for input(s): HGBA1C in the last 72 hours. ?CBG: ?Recent Labs  ?Lab 09/29/21 ?2358 09/30/21 ?0405 09/30/21 ?0726  09/30/21 ?1116 09/30/21 ?1605  ?GLUCAP 129* 169* 146* 133* 139*  ? ?Lipid Profile: ?No results for input(s): CHOL, HDL, LDLCALC, TRIG, CHOLHDL, LDLDIRECT in the last 72 hours. ?Thyroid Function Tests: ?No results for input(s): TSH, T4TOTAL, FREET4, T3FREE, THYROIDAB in the last 72 hours. ?Anemia Panel: ?No results for input(s): VITAMINB12, FOLATE, FERRITIN, TIBC, IRON, RETICCTPCT in the last 72 hours. ?Sepsis Labs: ?Recent Labs  ?Lab 09/28/21 ?1518 09/28/21 ?1848 09/29/21 ?1728  ?PROCALCITON  --  <0.10  --   ?LATICACIDVEN 4.9* 3.2* 1.5  ? ? ?Recent Results (from the past 240 hour(s))  ?Resp Panel by RT-PCR (Flu A&B, Covid) Nasopharyngeal Swab     Status: None  ? Collection Time: 09/28/21  3:02 PM  ? Specimen: Nasopharyngeal Swab; Nasopharyngeal(NP) swabs in vial transport medium  ?Result Value Ref Range Status  ? SARS Coronavirus 2 by RT PCR NEGATIVE NEGATIVE Final  ?  Comment: (NOTE) ?SARS-CoV-2 target nucleic acids are NOT DETECTED. ? ?The SARS-CoV-2 RNA is generally detectable in upper respiratory ?specimens during the acute phase of infection. The lowest ?concentration of SARS-CoV-2 viral copies this assay can detect is ?138 copies/mL. A negative result does not preclude SARS-Cov-2 ?infection and should not be used as the sole basis for treatment  or ?other patient management decisions. A negative result may occur with  ?improper specimen collection/handling, submission of specimen other ?than nasopharyngeal swab, presence of viral mutation(s) within the ?areas targeted by this assay, and inadequate number of viral ?copies(<138 copies/mL). A negative result must be combined with ?clinical observations, patient history, and epidemiological ?information. The expected result is Negative. ? ?Fact Sheet for Patients:  ?EntrepreneurPulse.com.au ? ?Fact Sheet for Healthcare Providers:  ?IncredibleEmployment.be ? ?This test is no t yet approved or cleared by the [place] FDA and   ?has been authorized for detection and/or diagnosis of SARS-CoV-2 by ?FDA under an Emergency Use Authorization (EUA). This EUA will remain  ?in effect (meaning this test can be used) for the duration of the ?COVID-19

## 2021-09-30 NOTE — Progress Notes (Signed)
ANTICOAGULATION CONSULT NOTE -  ? ?Pharmacy Consult for heparin ?Indication: pulmonary embolus ? ?No Known Allergies ? ?Patient Measurements: ?Height: '5\' 10"'$  (177.8 cm) ?Weight: 74 kg (163 lb 2.3 oz) ?IBW/kg (Calculated) : 73 ? ?Vital Signs: ?Temp: 98 ?F (36.7 ?C) (05/16 1114) ?Temp Source: Axillary (05/16 1114) ?BP: 161/82 (05/16 1200) ?Pulse Rate: 96 (05/16 1200) ? ?Labs: ?Recent Labs  ?  09/28/21 ?1518 09/29/21 ?0400 09/29/21 ?1008 09/29/21 ?2012 09/30/21 ?1937  ?HGB 16.8 15.8  --   --  14.6  ?HCT 47.8 46.3  --   --  43.1  ?PLT 210 171  --   --  143*  ?APTT 32  --   --   --   --   ?LABPROT 14.3 15.4*  --   --   --   ?INR 1.1 1.2  --   --   --   ?HEPARINUNFRC  --   --  0.79* 0.37  --   ?CREATININE 1.00 0.85  --   --  0.72  ? ? ? ?Estimated Creatinine Clearance: 74.8 mL/min (by C-G formula based on SCr of 0.72 mg/dL). ? ? ?Medical History: ?Past Medical History:  ?Diagnosis Date  ? Diabetes ([place])   ? High cholesterol   ? Hypertension   ? Kidney stone   ? ? ?Medications:  ?Medications Prior to Admission  ?Medication Sig Dispense Refill Last Dose  ? acetaminophen (TYLENOL) 325 MG tablet Take 2 tablets (650 mg total) by mouth every 4 (four) hours as needed for mild pain (or temp > 37.5 C (99.5 F)). 12 tablet 0 Past Week  ? aspirin EC 81 MG EC tablet Take 1 tablet (81 mg total) by mouth daily with breakfast. Please take Aspirin 81 mg daily along with Plavix 75 mg daily for 21 days then after that STOP the Plavix  and continue ONLY Aspirin 81 mg daily indefinitely--for  stroke Prevention (Patient taking differently: Take 81 mg by mouth daily with breakfast.) 30 tablet 11 09/27/2021  ? glipiZIDE (GLUCOTROL) 10 MG tablet Take 1 tablet (10 mg total) by mouth 2 (two) times daily before a meal. 180 tablet 4 09/27/2021  ? indomethacin (INDOCIN) 50 MG capsule Take 50 mg by mouth 2 (two) times daily.   09/27/2021  ? lisinopril-hydrochlorothiazide (PRINZIDE,ZESTORETIC) 20-25 MG per tablet Take 1 tablet by mouth daily.   09/27/2021   ? metFORMIN (GLUCOPHAGE) 1000 MG tablet Take 1 tablet (1,000 mg total) by mouth 2 (two) times daily with a meal. 180 tablet 4 09/27/2021  ? clopidogrel (PLAVIX) 75 MG tablet Take 1 tablet (75 mg total) by mouth daily. Please take Aspirin 81 mg daily along with Plavix 75 mg daily for 21 days then after that STOP the Plavix  and continue ONLY Aspirin 81 mg daily indefinitely--for  stroke Prevention (Patient not taking: Reported on 09/28/2021) 30 tablet 0 Completed Course  ? ?Scheduled:  ? acetaminophen  650 mg Oral Once  ? chlorhexidine  15 mL Mouth Rinse BID  ? Chlorhexidine Gluconate Cloth  6 each Topical Daily  ? insulin aspart  0-20 Units Subcutaneous Q4H  ? mouth rinse  15 mL Mouth Rinse q12n4p  ? ?Infusions:  ? acyclovir Stopped (09/30/21 0929)  ? ampicillin (OMNIPEN) IV 300 mL/hr at 09/30/21 1300  ? cefTRIAXone (ROCEPHIN)  IV Stopped (09/30/21 1034)  ? dextrose 5 % with kcl 75 mL/hr at 09/30/21 1300  ? heparin    ? vancomycin Stopped (09/30/21 9024)  ? ? ?Assessment: ?81yo male was found unresponsive by  family >> started on ABX for sepsis, now w/ CT revealing mild PE in RUL >> to begin heparin. ? ?Heparin previously therapeutic on 1100 units/hr. Restart heparin post procedure per MD ? ?Goal of Therapy:  ?Heparin level 0.3-0.7 units/ml ?Monitor platelets by anticoagulation protocol: Yes ?  ?Plan:  ?Restart heparin infusion at 1100 units/hr  ?Heparin level in 8 hours and daily ? ?Margot Ables, PharmD ?Clinical Pharmacist ?09/30/2021 1:11 PM ? ? ? ? ? ? ?

## 2021-09-30 NOTE — Consult Note (Signed)
?  [facility] for Infectious Disease  ? ? ?Date of Admission:  09/28/2021    ? ?Reason for Consult: Encephalopathy ?    ?Referring Physician: Dr Roderic Palau ? ?Current antibiotics: ?Ceftriaxone ?Ampicillin ?Vancomycin ?ACV ? ? ? ?ASSESSMENT:   ? ?81 y.o. male admitted with: ? ?Sepsis and acute encephalopathy: Noted to be febrile with leukocytosis on admission coinciding with abrupt onset of AMS of unclear etiology.  MRI brain, EEG, and ABG did not elicit a potential etiology.  Planning for LP in radiology today for further evaluation and currently on broad spectrum antimicrobials.  ?Acute PE: CTA on admission significant for small RUL PE and no heart strain.  Managed currently with heparin gtt.  LE DVT study was negative. ?Uncontrolled DM: A1c is 12.5. ? ?RECOMMENDATIONS:   ? ?Continue broad spectrum coverage pending LP ?Send cell count with diff, cultures, protein, glucose, HSV PCR ?Check HIV ?Follow up blood cultures ?Glycemic control  ?Lab monitoring ?Will follow ? ? ?Principal Problem: ?  Sepsis ([place]) ?Active Problems: ?  Essential hypertension ?  Acute pulmonary embolus ([facility]) ?  Acute respiratory failure with hypoxia ([place]) ?  Uncontrolled type 2 diabetes mellitus with hyperglycemia, with long-term current use of insulin ([place]) ?  Hypokalemia ?  Atrial fibrillation with rapid ventricular response ([place]) ? ? ?MEDICATIONS:   ? ?Scheduled Meds: ? acetaminophen  650 mg Oral Once  ? chlorhexidine  15 mL Mouth Rinse BID  ? Chlorhexidine Gluconate Cloth  6 each Topical Daily  ? insulin aspart  0-20 Units Subcutaneous Q4H  ? mouth rinse  15 mL Mouth Rinse q12n4p  ? ?Continuous Infusions: ? acyclovir 710 mg (09/30/21 0819)  ? ampicillin (OMNIPEN) IV 2 g (09/30/21 0714)  ? cefTRIAXone (ROCEPHIN)  IV Stopped (09/29/21 1553)  ? dextrose 5 % with kcl    ? potassium chloride    ? vancomycin 150 mL/hr at 09/30/21 0700  ? ?PRN Meds:.acetaminophen **OR** acetaminophen, hydrALAZINE, labetalol, magnesium hydroxide, ondansetron **OR**  ondansetron (ZOFRAN) IV, traZODone ? ?HPI:   ? ?Calvin Mills is a 81 y.o. male with PMHx noted below.  Virtual consult requested and done via ICU camera and chart review. ? ?[facility]. ? ?Noted to be in normal state of health 5/13.  Was then found the following day by his son with severe AMS.  Brought to ED noted to be febrile 103F without obvious source.  Started on abx and LP attempted but unsuccessful. ? ?Imaging with MRI brain unremarkable, lower extremity DVT study negative, CXR negative. CTA PE study with mild PE involving RUL.  No heart strain.  ? ?He is on broad spectrum coverage for suspected CNS infection.  Blood cx are NGTD  ? ? ? ? ?Past Medical History:  ?Diagnosis Date  ? Diabetes ([place])   ? High cholesterol   ? Hypertension   ? Kidney stone   ? ? ?Social History  ? ?Tobacco Use  ? Smoking status: Former  ?  Packs/day: 0.50  ?  Years: 30.00  ?  Pack years: 15.00  ?  Types: Cigarettes  ? Tobacco comments:  ?  quit 25 yrs ago  ?Vaping Use  ? Vaping Use: Never used  ?Substance Use Topics  ? Alcohol use: No  ?  Alcohol/week: 0.0 standard drinks  ? Drug use: No  ? ? ?History reviewed. No pertinent family history. ? ?No Known Allergies ? ?Review of Systems  ?Unable to perform ROS: Mental status change  ? ?OBJECTIVE:  ? ?  Blood pressure (!) 165/85, pulse (!) 47, temperature (!) 97.5 ?F (36.4 ?C), temperature source Axillary, resp. rate 16, height '5\' 10"'$  (1.778 m), weight 74 kg, SpO2 96 %. ?Body mass index is 23.41 kg/m?. ? ?Physical Exam ?Constitutional:   ?   Comments: Patient seen on video.  Lying in ICU bed, not responsive.  Getting patient care from multiple staff members.   ? ? ? ?Lab Results: ?Lab Results  ?Component Value Date  ? WBC 13.0 (H) 09/30/2021  ? HGB 14.6 09/30/2021  ? HCT 43.1 09/30/2021  ? MCV 96.9 09/30/2021  ? PLT 143 (L) 09/30/2021  ?  ?Lab Results  ?Component Value Date  ? NA 146 (H) 09/30/2021  ? K 2.8 (L) 09/30/2021  ? CO2 26 09/30/2021  ? GLUCOSE 179  (H) 09/30/2021  ? BUN 30 (H) 09/30/2021  ? CREATININE 0.72 09/30/2021  ? CALCIUM 8.4 (L) 09/30/2021  ? GFRNONAA >60 09/30/2021  ?  ?Lab Results  ?Component Value Date  ? ALT 25 09/30/2021  ? AST 32 09/30/2021  ? ALKPHOS 53 09/30/2021  ? BILITOT 1.4 (H) 09/30/2021  ? ? ?No results found for: CRP ? ?No results found for: ESRSEDRATE ? ?I have reviewed the micro and lab results in Epic. ? ?Imaging: ?CT ANGIO HEAD NECK W WO CM ? ?Result Date: 09/28/2021 ?CLINICAL DATA:  Neuro deficit, acute, stroke suspected. Altered mental status. Found unresponsive today. EXAM: CT ANGIOGRAPHY HEAD AND NECK TECHNIQUE: Multidetector CT imaging of the head and neck was performed using the standard protocol during bolus administration of intravenous contrast. Multiplanar CT image reconstructions and MIPs were obtained to evaluate the vascular anatomy. Carotid stenosis measurements (when applicable) are obtained utilizing NASCET criteria, using the distal internal carotid diameter as the denominator. RADIATION DOSE REDUCTION: This exam was performed according to the departmental dose-optimization program which includes automated exposure control, adjustment of the mA and/or kV according to patient size and/or use of iterative reconstruction technique. CONTRAST:  67m OMNIPAQUE IOHEXOL 350 MG/ML SOLN COMPARISON:  Head and neck MRA 05/29/2021 FINDINGS: CTA NECK FINDINGS Aortic arch: Standard 3 vessel aortic arch with moderate soft and calcified plaque. Patent brachiocephalic and subclavian arteries with 50% stenosis of the left subclavian origin. Right carotid system: Patent with a mild-to-moderate amount of calcified and soft plaque at the carotid bifurcation. No evidence of a significant stenosis or dissection. Left carotid system: Patent with scattered soft plaque in the common carotid artery and a small to moderate amount of calcified plaque the carotid bifurcation. No evidence of a significant stenosis or dissection. Vertebral arteries:  Patent with calcified plaque at the left greater than right vertebral artery origins. No evidence of a significant stenosis or dissection. Codominant. Skeleton: Advanced disc degeneration in the mid and lower cervical spine. Advanced left facet arthrosis at C3-4. Moderate to severe bilateral neural foraminal stenosis at C4-5 due to uncovertebral spurring. Other neck: No evidence of cervical lymphadenopathy or mass. Upper chest: Moderate centrilobular emphysema. Review of the MIP images confirms the above findings CTA HEAD FINDINGS Anterior circulation: The internal carotid arteries are patent from skull base to carotid termini with mild atherosclerotic plaque resulting in at most mild paraclinoid stenosis bilaterally. ACAs and MCAs are patent without evidence of a proximal branch occlusion or significant proximal stenosis. Incidental note is made of a median artery of the corpus callosum and mildly dominant left ACA. No aneurysm is identified. Posterior circulation: The intracranial vertebral arteries are widely patent to the basilar. Patent PICA and SCA origins are  seen bilaterally. The basilar artery is patent with mild irregularity but no significant stenosis. Posterior communicating arteries are diminutive or absent. Both PCAs are patent without evidence of a significant proximal stenosis. No aneurysm is identified. Venous sinuses: Diminutive and poorly visualized left transverse and left sigmoid sinuses, likely congenital. Other major dural venous sinuses are patent. Anatomic variants: None of significance. Review of the MIP images confirms the above findings IMPRESSION: 1. Mild intracranial atherosclerosis without large vessel occlusion or flow limiting proximal stenosis. 2. Cervical carotid artery atherosclerosis without significant stenosis. 3. 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 4. Aortic Atherosclerosis (ICD10-I70.0) and Emphysema (ICD10-J43.9). Electronically Signed   By: Logan Bores M.D.   On:  09/28/2021 19:24  ? ?DG Chest 1 View ? ?Result Date: 09/28/2021 ?CLINICAL DATA:  Unresponsive, altered mental status EXAM: CHEST  1 VIEW COMPARISON:  09/30/2004 FINDINGS: The heart size and mediastinal c

## 2021-09-30 NOTE — Progress Notes (Signed)
PT tolerated lumbar puncture procedure well today and specimens collected and sent to lab for processing at this time. PT transported back via inpatient bed to ICU at this time and radiologist updated Son Elta [place]) at this time. 11 mL total of CSF was collected. Patient's vital signs stable and no acute distress noted upon departure.  ?

## 2021-10-01 DIAGNOSIS — R6520 Severe sepsis without septic shock: Secondary | ICD-10-CM | POA: Diagnosis not present

## 2021-10-01 DIAGNOSIS — A419 Sepsis, unspecified organism: Secondary | ICD-10-CM | POA: Diagnosis not present

## 2021-10-01 DIAGNOSIS — G9340 Encephalopathy, unspecified: Secondary | ICD-10-CM | POA: Diagnosis not present

## 2021-10-01 LAB — GLUCOSE, CAPILLARY
Glucose-Capillary: 166 mg/dL — ABNORMAL HIGH (ref 70–99)
Glucose-Capillary: 170 mg/dL — ABNORMAL HIGH (ref 70–99)
Glucose-Capillary: 168 mg/dL — ABNORMAL HIGH (ref 70–99)
Glucose-Capillary: 240 mg/dL — ABNORMAL HIGH (ref 70–99)
Glucose-Capillary: 184 mg/dL — ABNORMAL HIGH (ref 70–99)

## 2021-10-01 LAB — CBC
HCT: 40.3 % (ref 39.0–52.0)
Hemoglobin: 13.8 g/dL (ref 13.0–17.0)
MCH: 33 pg (ref 26.0–34.0)
MCHC: 34.2 g/dL (ref 30.0–36.0)
MCV: 96.4 fL (ref 80.0–100.0)
Platelets: 109 10*3/uL — ABNORMAL LOW (ref 150–400)
RBC: 4.18 MIL/uL — ABNORMAL LOW (ref 4.22–5.81)
RDW: 13.7 % (ref 11.5–15.5)
WBC: 8 10*3/uL (ref 4.0–10.5)
nRBC: 0 % (ref 0.0–0.2)

## 2021-10-01 LAB — HEPARIN LEVEL (UNFRACTIONATED)
Heparin Unfractionated: 0.25 IU/mL — ABNORMAL LOW (ref 0.30–0.70)
Heparin Unfractionated: 0.37 IU/mL (ref 0.30–0.70)
Heparin Unfractionated: 0.36 IU/mL (ref 0.30–0.70)

## 2021-10-01 LAB — COMPREHENSIVE METABOLIC PANEL
ALT: 24 U/L (ref 0–44)
AST: 31 U/L (ref 15–41)
Albumin: 2.8 g/dL — ABNORMAL LOW (ref 3.5–5.0)
Alkaline Phosphatase: 54 U/L (ref 38–126)
Anion gap: 4 — ABNORMAL LOW (ref 5–15)
BUN: 18 mg/dL (ref 8–23)
CO2: 24 mmol/L (ref 22–32)
Calcium: 7.8 mg/dL — ABNORMAL LOW (ref 8.9–10.3)
Chloride: 114 mmol/L — ABNORMAL HIGH (ref 98–111)
Creatinine, Ser: 0.47 mg/dL — ABNORMAL LOW (ref 0.61–1.24)
GFR, Estimated: >60 mL/min (ref >60)
Glucose, Bld: 206 mg/dL — ABNORMAL HIGH (ref 70–99)
Potassium: 3.1 mmol/L — ABNORMAL LOW (ref 3.5–5.1)
Sodium: 142 mmol/L (ref 135–145)
Total Bilirubin: 0.9 mg/dL (ref 0.3–1.2)
Total Protein: 5.6 g/dL — ABNORMAL LOW (ref 6.5–8.1)

## 2021-10-01 LAB — HEMOGLOBIN A1C
Hgb A1c MFr Bld: 10.4 % — ABNORMAL HIGH (ref 4.8–5.6)
Mean Plasma Glucose: 251.78 mg/dL

## 2021-10-01 LAB — MAGNESIUM: Magnesium: 1.6 mg/dL — ABNORMAL LOW (ref 1.7–2.4)

## 2021-10-01 MED ORDER — POTASSIUM CHLORIDE 10 MEQ/100ML IV SOLN
10.0000 meq | INTRAVENOUS | Status: AC
  Administered 2021-10-01 (×4): 10 meq via INTRAVENOUS
  Filled 2021-10-01: qty 100

## 2021-10-01 MED ORDER — ACYCLOVIR SODIUM 50 MG/ML IV SOLN
INTRAVENOUS | Status: AC
  Filled 2021-10-01: qty 10

## 2021-10-01 MED ORDER — INSULIN ASPART 100 UNIT/ML IJ SOLN
0.0000 [IU] | Freq: Three times a day (TID) | INTRAMUSCULAR | Status: DC
  Administered 2021-10-01 – 2021-10-02 (×2): 7 [IU] via SUBCUTANEOUS
  Administered 2021-10-02 – 2021-10-03 (×4): 4 [IU] via SUBCUTANEOUS
  Administered 2021-10-04: 3 [IU] via SUBCUTANEOUS
  Administered 2021-10-04: 4 [IU] via SUBCUTANEOUS
  Administered 2021-10-05: 3 [IU] via SUBCUTANEOUS
  Administered 2021-10-05 – 2021-10-06 (×2): 4 [IU] via SUBCUTANEOUS
  Administered 2021-10-06: 7 [IU] via SUBCUTANEOUS
  Administered 2021-10-06 – 2021-10-07 (×2): 3 [IU] via SUBCUTANEOUS
  Administered 2021-10-07: 7 [IU] via SUBCUTANEOUS

## 2021-10-01 MED ORDER — MAGNESIUM SULFATE 2 GM/50ML IV SOLN
2.0000 g | Freq: Once | INTRAVENOUS | Status: AC
  Administered 2021-10-01: 2 g via INTRAVENOUS
  Filled 2021-10-01: qty 50

## 2021-10-01 MED ORDER — INSULIN ASPART 100 UNIT/ML IJ SOLN: 0.0000 [IU] | Freq: Every day | INTRAMUSCULAR | Status: DC

## 2021-10-01 NOTE — Progress Notes (Signed)
ANTICOAGULATION CONSULT NOTE - Follow Up Consult ? ?Pharmacy Consult for heparin ?Indication: pulmonary embolus ? ?Labs: ?Recent Labs  ?  09/28/21 ?1518 09/29/21 ?0400 09/29/21 ?1008 09/29/21 ?2012 09/30/21 ?7106 09/30/21 ?2231  ?HGB 16.8 15.8  --   --  14.6  --   ?HCT 47.8 46.3  --   --  43.1  --   ?PLT 210 171  --   --  143*  --   ?APTT 32  --   --   --   --   --   ?LABPROT 14.3 15.4*  --   --   --   --   ?INR 1.1 1.2  --   --   --   --   ?HEPARINUNFRC  --   --  0.79* 0.37  --  0.25*  ?CREATININE 1.00 0.85  --   --  0.72  --   ? ? ?Assessment: ?81yo male subtherapeutic on heparin after resumed s/p procedure; no infusion issues or signs of bleeding per RN. ? ?Goal of Therapy:  ?Heparin level 0.3-0.7 units/ml ?  ?Plan:  ?Will increase heparin infusion slightly to 1200 units/hr and check level with am labs.   ? ?Wynona Neat, PharmD, BCPS  ?10/01/2021,12:27 AM ? ? ?

## 2021-10-01 NOTE — Progress Notes (Signed)
Pt weaned to room air, tolerating well. O2 sat's within the 92-95% range. Will continue to monitor.  ?

## 2021-10-01 NOTE — Progress Notes (Signed)
ANTICOAGULATION CONSULT NOTE -  ? ?Pharmacy Consult for heparin ?Indication: pulmonary embolus ? ?No Known Allergies ? ?Patient Measurements: ?Height: '5\' 10"'$  (177.8 cm) ?Weight: 74.1 kg (163 lb 5.8 oz) ?IBW/kg (Calculated) : 73 ? ?Vital Signs: ?Temp: 96.3 ?F (35.7 ?C) (05/17 0735) ?Temp Source: Axillary (05/17 0735) ?BP: 138/62 (05/17 1000) ?Pulse Rate: 83 (05/17 1000) ? ?Labs: ?Recent Labs  ?  09/28/21 ?1518 09/29/21 ?0400 09/29/21 ?1008 09/30/21 ?0240 09/30/21 ?2231 10/01/21 ?0422 10/01/21 ?0900  ?HGB 16.8 15.8  --  14.6  --  13.8  --   ?HCT 47.8 46.3  --  43.1  --  40.3  --   ?PLT 210 171  --  143*  --  109*  --   ?APTT 32  --   --   --   --   --   --   ?LABPROT 14.3 15.4*  --   --   --   --   --   ?INR 1.1 1.2  --   --   --   --   --   ?HEPARINUNFRC  --   --    < >  --  0.25* 0.25* 0.37  ?CREATININE 1.00 0.85  --  0.72  --  0.47*  --   ? < > = values in this interval not displayed.  ? ? ? ?Estimated Creatinine Clearance: 74.8 mL/min (A) (by C-G formula based on SCr of 0.47 mg/dL (L)). ? ? ?Medical History: ?Past Medical History:  ?Diagnosis Date  ? Diabetes ([place])   ? High cholesterol   ? Hypertension   ? Kidney stone   ? ? ?Medications:  ?Medications Prior to Admission  ?Medication Sig Dispense Refill Last Dose  ? acetaminophen (TYLENOL) 325 MG tablet Take 2 tablets (650 mg total) by mouth every 4 (four) hours as needed for mild pain (or temp > 37.5 C (99.5 F)). 12 tablet 0 Past Week  ? aspirin EC 81 MG EC tablet Take 1 tablet (81 mg total) by mouth daily with breakfast. Please take Aspirin 81 mg daily along with Plavix 75 mg daily for 21 days then after that STOP the Plavix  and continue ONLY Aspirin 81 mg daily indefinitely--for  stroke Prevention (Patient taking differently: Take 81 mg by mouth daily with breakfast.) 30 tablet 11 09/27/2021  ? glipiZIDE (GLUCOTROL) 10 MG tablet Take 1 tablet (10 mg total) by mouth 2 (two) times daily before a meal. 180 tablet 4 09/27/2021  ? indomethacin (INDOCIN) 50 MG capsule  Take 50 mg by mouth 2 (two) times daily.   09/27/2021  ? lisinopril-hydrochlorothiazide (PRINZIDE,ZESTORETIC) 20-25 MG per tablet Take 1 tablet by mouth daily.   09/27/2021  ? metFORMIN (GLUCOPHAGE) 1000 MG tablet Take 1 tablet (1,000 mg total) by mouth 2 (two) times daily with a meal. 180 tablet 4 09/27/2021  ? clopidogrel (PLAVIX) 75 MG tablet Take 1 tablet (75 mg total) by mouth daily. Please take Aspirin 81 mg daily along with Plavix 75 mg daily for 21 days then after that STOP the Plavix  and continue ONLY Aspirin 81 mg daily indefinitely--for  stroke Prevention (Patient not taking: Reported on 09/28/2021) 30 tablet 0 Completed Course  ? ?Scheduled:  ? acetaminophen  650 mg Oral Once  ? chlorhexidine  15 mL Mouth Rinse BID  ? Chlorhexidine Gluconate Cloth  6 each Topical Daily  ? insulin aspart  0-20 Units Subcutaneous Q4H  ? mouth rinse  15 mL Mouth Rinse q12n4p  ? ?Infusions:  ?  acyclovir Stopped (10/01/21 6644)  ? cefTRIAXone (ROCEPHIN)  IV    ? dextrose 5 % with kcl Stopped (10/01/21 0932)  ? heparin 1,200 Units/hr (10/01/21 1029)  ? potassium chloride 100 mL/hr at 10/01/21 1029  ? ? ?Assessment: ?81yo male was found unresponsive by family >> started on ABX for sepsis, now w/ CT revealing mild PE in RUL >> to begin heparin. ? ?HL 0.37- therapeutic ? ?Goal of Therapy:  ?Heparin level 0.3-0.7 units/ml ?Monitor platelets by anticoagulation protocol: Yes ?  ?Plan:  ?Continue heparin infusion at 1200 units/hr  ?Heparin level in 8 hours and daily ? ?Margot Ables, PharmD ?Clinical Pharmacist ?10/01/2021 10:32 AM ? ? ? ? ? ? ?

## 2021-10-01 NOTE — Progress Notes (Signed)
?PROGRESS NOTE ? ? ? ?Calvin Mills  ZJI:[id number] DOB: [birth year] DOA: 09/28/2021 ?PCP: Redmond School, MD ? ? ?Brief Narrative:  ? ?81 year old male who was in his usual state of health on 5/13, was found by his son with severe altered mental status/unresponsive on 5/14.  He was brought to the ER where he was noted to be febrile and a temperature of 103 ?F.  There was no clear source of infection.  Started on intravenous antibiotics.  Further infectious work-up is underway.  Also noted to have right-sided pulmonary emboli, started on anticoagulation. ? ?Assessment & Plan: ?  ?Principal Problem: ?  Sepsis ([place]) ?Active Problems: ?  Acute pulmonary embolus ([place]) ?  Acute respiratory failure with hypoxia ([place]) ?  Uncontrolled type 2 diabetes mellitus with hyperglycemia, with long-term current use of insulin ([place]) ?  Atrial fibrillation with rapid ventricular response ([place]) ?  Essential hypertension ?  Hypokalemia ? ?Assessment and Plan: ? ? ?Sepsis, present on admission ?-Etiology is unclear, although concern for CNS infection ?-Lactic acid noted to be elevated on admission, improved with IV fluids ?-Noted to be febrile, tachycardic, tachypneic on admission ?-Blood cultures have not shown any growth as of yet ?-He has been started on intravenous antibiotics ?-Urinalysis is not convincing for infection ?-Chest x-ray/CT did not underlying pneumonia ?-Infectious disease following ?-Lumbar puncture today with initial results not indicating overwhelming infectious process ?-Continue to follow culture results ?  ?Acute encephalopathy ?-Related to sepsis, fever 103F on admission ?-MRI brain did not show any acute findings ?-EEG did not show any epileptiform discharges ?-ABG did not show any hypercarbia ?-Ammonia only minimally elevated ?-No recent changes in medication ?-He is not on any sedatives at home, he is not on SSRIs ?-Change in mental status was relatively sudden, was last in usual state of health 5/13, and by 5/14  he was unresponsive ?-Lumbar puncture done 5/16 with initial studies not terribly convincing of underlying infection ?-Discussed with infectious disease I will narrow antibiotic coverage to Rocephin daily.  We will also continue acyclovir for now pending HSV PCR ?-Initial Gram stain results with no significant findings and culture pending ?  ?Acute pulmonary embolus ?-Have right-sided PE on CT imaging ?-Started on heparin infusion ?-Echo without evidence of right-sided heart strain ?-Venous Dopplers of lower extremities negative for DVT ?-We will eventually transition to DOAC if condition stabilizes ?  ?Atrial fibrillation ?-Continue beta-blockers for rate control ?-Currently on anticoagulation with heparin infusion ?-Echocardiogram shows preserved ejection fraction ?  ?Diabetes ?-Continue on sliding scale insulin ?-Blood sugars elevated on admission, he was only taking oral agents prior to admission ?-Blood sugars currently stable ?-Follow-up A1c ?  ?Hypertension-stable ?-Chronically on lisinopril/hydrochlorothiazide ?-Holding now since he is n.p.o. ?-Use labetalol IV as needed ?  ?Hypokalemia/Hypomagnesemia ?-Replace ?  ?  ?DVT prophylaxis:   Heparin infusion ?  ?Code Status: DNR ?Family Communication: Updated son at the bedside ?Disposition Plan: Status is: Inpatient ?Remains inpatient appropriate because: Remains altered mental status needing further work-up ?  ?  ?  ?  ?Consultants:  ?Infectious disease ?  ?Procedures:  ?  ?  ?Antimicrobials:  ?Anti-infectives (From admission, onward)  ? ? Start     Dose/Rate Route Frequency Ordered Stop  ? 10/01/21 1004  cefTRIAXone (ROCEPHIN) 2 g in sodium chloride 0.9 % 100 mL IVPB       ? 2 g ?200 mL/hr over 30 Minutes Intravenous Every 24 hours 09/30/21 1341    ? 09/29/21 1700  acyclovir (ZOVIRAX) 710 mg  in dextrose 5 % 250 mL IVPB       ? 10 mg/kg ? 70.9 kg ?264.2 mL/hr over 60 Minutes Intravenous Every 8 hours 09/29/21 1503    ? 09/29/21 1600  ampicillin (OMNIPEN) 2 g in  sodium chloride 0.9 % 100 mL IVPB  Status:  Discontinued       ? 2 g ?300 mL/hr over 20 Minutes Intravenous Every 4 hours 09/29/21 1452 09/30/21 1341  ? 09/29/21 1600  cefTRIAXone (ROCEPHIN) 2 g in sodium chloride 0.9 % 100 mL IVPB  Status:  Discontinued       ? 2 g ?200 mL/hr over 30 Minutes Intravenous Every 12 hours 09/29/21 1452 09/30/21 1341  ? 09/29/21 1200  ceFEPIme (MAXIPIME) 2 g in sodium chloride 0.9 % 100 mL IVPB  Status:  Discontinued       ? 2 g ?200 mL/hr over 30 Minutes Intravenous Every 8 hours 09/29/21 1051 09/29/21 1452  ? 09/29/21 0600  vancomycin (VANCOREADY) IVPB 750 mg/150 mL  Status:  Discontinued       ? 750 mg ?150 mL/hr over 60 Minutes Intravenous Every 12 hours 09/28/21 1555 09/30/21 1341  ? 09/29/21 0500  ceFEPIme (MAXIPIME) 2 g in sodium chloride 0.9 % 100 mL IVPB  Status:  Discontinued       ? 2 g ?200 mL/hr over 30 Minutes Intravenous Every 12 hours 09/28/21 1555 09/29/21 1051  ? 09/29/21 0300  metroNIDAZOLE (FLAGYL) IVPB 500 mg  Status:  Discontinued       ? 500 mg ?100 mL/hr over 60 Minutes Intravenous Every 12 hours 09/28/21 2156 09/29/21 1452  ? 09/28/21 2200  ceFEPIme (MAXIPIME) 2 g in sodium chloride 0.9 % 100 mL IVPB  Status:  Discontinued       ? 2 g ?200 mL/hr over 30 Minutes Intravenous  Once 09/28/21 2156 09/28/21 2159  ? 09/28/21 2200  vancomycin (VANCOCIN) IVPB 1000 mg/200 mL premix  Status:  Discontinued       ? 1,000 mg ?200 mL/hr over 60 Minutes Intravenous  Once 09/28/21 2156 09/28/21 2159  ? 09/28/21 1600  vancomycin (VANCOREADY) IVPB 1500 mg/300 mL       ? 1,500 mg ?150 mL/hr over 120 Minutes Intravenous  Once 09/28/21 1550 09/28/21 1835  ? 09/28/21 1530  ceFEPIme (MAXIPIME) 2 g in sodium chloride 0.9 % 100 mL IVPB       ? 2 g ?200 mL/hr over 30 Minutes Intravenous  Once 09/28/21 1523 09/28/21 1631  ? 09/28/21 1530  vancomycin (VANCOCIN) IVPB 1000 mg/200 mL premix  Status:  Discontinued       ? 1,000 mg ?200 mL/hr over 60 Minutes Intravenous  Once 09/28/21 1523  09/28/21 1550  ? 09/28/21 1515  metroNIDAZOLE (FLAGYL) IVPB 500 mg       ? 500 mg ?100 mL/hr over 60 Minutes Intravenous  Once 09/28/21 1503 09/28/21 1630  ? ?  ? ?Subjective: ?Patient seen and evaluated today with no new acute complaints or concerns. No acute concerns or events noted overnight.  He is not at baseline level of alertness, but is slowly improving. ? ?Objective: ?Vitals:  ? 10/01/21 0815 10/01/21 0900 10/01/21 0907 10/01/21 1000  ?BP:    138/62  ?Pulse: 78 80 89 83  ?Resp: '17 19 17 18  '$ ?Temp:      ?TempSrc:      ?SpO2: 98% 98% 96% 98%  ?Weight:      ?Height:      ? ? ?Intake/Output Summary (  Last 24 hours) at 10/01/2021 1025 ?Last data filed at 10/01/2021 0900 ?Gross per 24 hour  ?Intake 3174.34 ml  ?Output 1040 ml  ?Net 2134.34 ml  ? ?Filed Weights  ? 09/29/21 0500 09/30/21 0408 10/01/21 0500  ?Weight: 70.9 kg 74 kg 74.1 kg  ? ? ?Examination: ? ?General exam: Appears calm and comfortable  ?Respiratory system: Clear to auscultation. Respiratory effort normal.  Currently on 2 L nasal cannula ?Cardiovascular system: S1 & S2 heard, RRR.  ?Gastrointestinal system: Abdomen is soft ?Central nervous system: Intermittently somnolent ?Extremities: No edema ?Skin: No significant lesions noted ?Psychiatry: Flat affect. ? ? ? ?Data Reviewed: I have personally reviewed following labs and imaging studies ? ?CBC: ?Recent Labs  ?Lab 09/28/21 ?1518 09/29/21 ?0400 09/30/21 ?2536 10/01/21 ?0422  ?WBC 14.2* 13.3* 13.0* 8.0  ?NEUTROABS 12.4*  --   --   --   ?HGB 16.8 15.8 14.6 13.8  ?HCT 47.8 46.3 43.1 40.3  ?MCV 93.0 95.7 96.9 96.4  ?PLT 210 171 143* 109*  ? ?Basic Metabolic Panel: ?Recent Labs  ?Lab 09/28/21 ?1518 09/29/21 ?0400 09/30/21 ?6440 10/01/21 ?0422  ?NA 139 145 146* 142  ?K 3.3* 3.4* 2.8* 3.1*  ?CL 102 109 115* 114*  ?CO2 '22 27 26 24  '$ ?GLUCOSE 471* 399* 179* 206*  ?BUN 21 24* 30* 18  ?CREATININE 1.00 0.85 0.72 0.47*  ?CALCIUM 9.1 8.7* 8.4* 7.8*  ?MG  --   --  1.7 1.6*  ? ?GFR: ?Estimated Creatinine Clearance: 74.8  mL/min (A) (by C-G formula based on SCr of 0.47 mg/dL (L)). ?Liver Function Tests: ?Recent Labs  ?Lab 09/28/21 ?1518 09/30/21 ?3474 10/01/21 ?0422  ?AST 44* 32 31  ?ALT '29 25 24  '$ ?ALKPHOS 84 53 54  ?BILI

## 2021-10-01 NOTE — Progress Notes (Signed)
ANTICOAGULATION CONSULT NOTE -  ? ?Pharmacy Consult for heparin ?Indication: pulmonary embolus ? ?No Known Allergies ? ?Patient Measurements: ?Height: '5\' 10"'$  (177.8 cm) ?Weight: 74.1 kg (163 lb 5.8 oz) ?IBW/kg (Calculated) : 73 ? ?Vital Signs: ?Temp: 98.3 ?F (36.8 ?C) (05/17 1626) ?Temp Source: Oral (05/17 1626) ?BP: 173/72 (05/17 2005) ?Pulse Rate: 88 (05/17 2005) ? ?Labs: ?Recent Labs  ?  09/29/21 ?0400 09/29/21 ?1008 09/30/21 ?2025 09/30/21 ?2231 10/01/21 ?0422 10/01/21 ?0900 10/01/21 ?1800  ?HGB 15.8  --  14.6  --  13.8  --   --   ?HCT 46.3  --  43.1  --  40.3  --   --   ?PLT 171  --  143*  --  109*  --   --   ?LABPROT 15.4*  --   --   --   --   --   --   ?INR 1.2  --   --   --   --   --   --   ?HEPARINUNFRC  --    < >  --    < > 0.25* 0.37 0.36  ?CREATININE 0.85  --  0.72  --  0.47*  --   --   ? < > = values in this interval not displayed.  ? ? ? ?Estimated Creatinine Clearance: 74.8 mL/min (A) (by C-G formula based on SCr of 0.47 mg/dL (L)). ? ? ?Medical History: ?Past Medical History:  ?Diagnosis Date  ? Diabetes ([place])   ? High cholesterol   ? Hypertension   ? Kidney stone   ? ? ?Medications:  ?Medications Prior to Admission  ?Medication Sig Dispense Refill Last Dose  ? acetaminophen (TYLENOL) 325 MG tablet Take 2 tablets (650 mg total) by mouth every 4 (four) hours as needed for mild pain (or temp > 37.5 C (99.5 F)). 12 tablet 0 Past Week  ? aspirin EC 81 MG EC tablet Take 1 tablet (81 mg total) by mouth daily with breakfast. Please take Aspirin 81 mg daily along with Plavix 75 mg daily for 21 days then after that STOP the Plavix  and continue ONLY Aspirin 81 mg daily indefinitely--for  stroke Prevention (Patient taking differently: Take 81 mg by mouth daily with breakfast.) 30 tablet 11 09/27/2021  ? glipiZIDE (GLUCOTROL) 10 MG tablet Take 1 tablet (10 mg total) by mouth 2 (two) times daily before a meal. 180 tablet 4 09/27/2021  ? indomethacin (INDOCIN) 50 MG capsule Take 50 mg by mouth 2 (two) times daily.    09/27/2021  ? lisinopril-hydrochlorothiazide (PRINZIDE,ZESTORETIC) 20-25 MG per tablet Take 1 tablet by mouth daily.   09/27/2021  ? metFORMIN (GLUCOPHAGE) 1000 MG tablet Take 1 tablet (1,000 mg total) by mouth 2 (two) times daily with a meal. 180 tablet 4 09/27/2021  ? clopidogrel (PLAVIX) 75 MG tablet Take 1 tablet (75 mg total) by mouth daily. Please take Aspirin 81 mg daily along with Plavix 75 mg daily for 21 days then after that STOP the Plavix  and continue ONLY Aspirin 81 mg daily indefinitely--for  stroke Prevention (Patient not taking: Reported on 09/28/2021) 30 tablet 0 Completed Course  ? ?Scheduled:  ? acetaminophen  650 mg Oral Once  ? Chlorhexidine Gluconate Cloth  6 each Topical Daily  ? insulin aspart  0-20 Units Subcutaneous TID WC  ? insulin aspart  0-5 Units Subcutaneous QHS  ? mouth rinse  15 mL Mouth Rinse q12n4p  ? ?Infusions:  ? acyclovir Stopped (10/01/21 1721)  ? cefTRIAXone (  ROCEPHIN)  IV Stopped (10/01/21 1110)  ? heparin 1,200 Units/hr (10/01/21 1900)  ? ? ?Assessment: ?81yo male was found unresponsive by family >> started on ABX for sepsis, now w/ CT revealing mild PE in RUL >> to begin heparin. ? ?HL 0.36- therapeutic ? ?Goal of Therapy:  ?Heparin level 0.3-0.7 units/ml ?Monitor platelets by anticoagulation protocol: Yes ?  ?Plan:  ?Continue heparin infusion at 1200 units/hr  ?Heparin level daily. ? ?Assunta Curtis, RPh ?Clinical Pharmacist ? ? ? ? ? ? ? ?

## 2021-10-02 DIAGNOSIS — A419 Sepsis, unspecified organism: Secondary | ICD-10-CM | POA: Diagnosis not present

## 2021-10-02 DIAGNOSIS — R6520 Severe sepsis without septic shock: Secondary | ICD-10-CM | POA: Diagnosis not present

## 2021-10-02 DIAGNOSIS — G9340 Encephalopathy, unspecified: Secondary | ICD-10-CM | POA: Diagnosis not present

## 2021-10-02 LAB — BASIC METABOLIC PANEL
Anion gap: 7 (ref 5–15)
BUN: 12 mg/dL (ref 8–23)
CO2: 23 mmol/L (ref 22–32)
Calcium: 8 mg/dL — ABNORMAL LOW (ref 8.9–10.3)
Chloride: 111 mmol/L (ref 98–111)
Creatinine, Ser: 0.58 mg/dL — ABNORMAL LOW (ref 0.61–1.24)
GFR, Estimated: >60 mL/min (ref >60)
Glucose, Bld: 236 mg/dL — ABNORMAL HIGH (ref 70–99)
Potassium: 3.2 mmol/L — ABNORMAL LOW (ref 3.5–5.1)
Sodium: 141 mmol/L (ref 135–145)

## 2021-10-02 LAB — CBC
HCT: 38.8 % — ABNORMAL LOW (ref 39.0–52.0)
Hemoglobin: 12.8 g/dL — ABNORMAL LOW (ref 13.0–17.0)
MCH: 31.4 pg (ref 26.0–34.0)
MCHC: 33 g/dL (ref 30.0–36.0)
MCV: 95.3 fL (ref 80.0–100.0)
Platelets: 110 10*3/uL — ABNORMAL LOW (ref 150–400)
RBC: 4.07 MIL/uL — ABNORMAL LOW (ref 4.22–5.81)
RDW: 13.2 % (ref 11.5–15.5)
WBC: 6.4 10*3/uL (ref 4.0–10.5)
nRBC: 0 % (ref 0.0–0.2)

## 2021-10-02 LAB — GLUCOSE, CAPILLARY
Glucose-Capillary: 221 mg/dL — ABNORMAL HIGH (ref 70–99)
Glucose-Capillary: 199 mg/dL — ABNORMAL HIGH (ref 70–99)
Glucose-Capillary: 170 mg/dL — ABNORMAL HIGH (ref 70–99)
Glucose-Capillary: 108 mg/dL — ABNORMAL HIGH (ref 70–99)

## 2021-10-02 LAB — HSV 1/2 PCR, CSF
HSV-1 DNA: NEGATIVE
HSV-2 DNA: NEGATIVE

## 2021-10-02 LAB — HEPARIN LEVEL (UNFRACTIONATED): Heparin Unfractionated: 0.32 IU/mL (ref 0.30–0.70)

## 2021-10-02 LAB — MAGNESIUM: Magnesium: 1.7 mg/dL (ref 1.7–2.4)

## 2021-10-02 MED ORDER — QUETIAPINE FUMARATE 25 MG PO TABS
25.0000 mg | ORAL_TABLET | Freq: Every day | ORAL | Status: DC
  Administered 2021-10-02: 25 mg via ORAL
  Filled 2021-10-02 (×2): qty 1

## 2021-10-02 MED ORDER — ACYCLOVIR SODIUM 50 MG/ML IV SOLN
INTRAVENOUS | Status: AC
  Filled 2021-10-02: qty 10

## 2021-10-02 MED ORDER — POTASSIUM CHLORIDE CRYS ER 20 MEQ PO TBCR
40.0000 meq | EXTENDED_RELEASE_TABLET | Freq: Once | ORAL | Status: AC
  Administered 2021-10-02: 40 meq via ORAL
  Filled 2021-10-02: qty 2

## 2021-10-02 MED ORDER — ENOXAPARIN SODIUM 80 MG/0.8ML IJ SOSY
80.0000 mg | PREFILLED_SYRINGE | Freq: Two times a day (BID) | INTRAMUSCULAR | Status: DC
  Administered 2021-10-02 – 2021-10-07 (×10): 80 mg via SUBCUTANEOUS
  Filled 2021-10-02 (×10): qty 0.8

## 2021-10-02 MED ORDER — HALOPERIDOL LACTATE 5 MG/ML IJ SOLN
2.0000 mg | Freq: Four times a day (QID) | INTRAMUSCULAR | Status: DC | PRN
  Administered 2021-10-02 – 2021-10-06 (×7): 2 mg via INTRAVENOUS
  Filled 2021-10-02 (×9): qty 1

## 2021-10-02 NOTE — Progress Notes (Signed)
PROGRESS NOTE    Calvin Mills  JQZ:[id number] DOB: [birth year] DOA: 09/28/2021 PCP: Redmond School, MD   Brief Narrative:    81 year old male who was in his usual state of health on 5/13, was found by his son with severe altered mental status/unresponsive on 5/14.  He was brought to the ER where he was noted to be febrile and a temperature of 103 F.  There was no clear source of infection.  Started on intravenous antibiotics.  Further infectious work-up is underway.  Also noted to have right-sided pulmonary emboli, started on anticoagulation.  Assessment & Plan:   Principal Problem:   Sepsis ([place]) Active Problems:   Acute pulmonary embolus (HCC)   Acute respiratory failure with hypoxia (HCC)   Uncontrolled type 2 diabetes mellitus with hyperglycemia, with long-term current use of insulin (HCC)   Atrial fibrillation with rapid ventricular response (HCC)   Essential hypertension   Hypokalemia  Assessment and Plan:   Sepsis, present on admission -Etiology is unclear, although concern for CNS infection -Lactic acid noted to be elevated on admission, improved with IV fluids -Noted to be febrile, tachycardic, tachypneic on admission -Blood cultures have not shown any growth as of yet -He has been started on intravenous antibiotics -Urinalysis is not convincing for infection -Chest x-ray/CT did not underlying pneumonia -Infectious disease following -Lumbar puncture today with initial results not indicating overwhelming infectious process -Continue to follow culture results   Acute encephalopathy -Related to sepsis, fever 103F on admission -MRI brain did not show any acute findings -EEG did not show any epileptiform discharges -ABG did not show any hypercarbia -Ammonia only minimally elevated -No recent changes in medication -He is not on any sedatives at home, he is not on SSRIs -Change in mental status was relatively sudden, was last in usual state of health 5/13, and by 5/14  he was unresponsive -Lumbar puncture done 5/16 with initial studies not terribly convincing of underlying infection -Discussed with infectious disease I will narrow antibiotic coverage to Rocephin daily.  We will also continue acyclovir for now pending HSV PCR -Initial Gram stain results with no significant findings and culture pending   Acute pulmonary embolus -Have right-sided PE on CT imaging -Started on heparin infusion, pharmacy will transition to Lovenox -Echo without evidence of right-sided heart strain -Venous Dopplers of lower extremities negative for DVT -We will eventually transition to DOAC if condition stabilizes   Atrial fibrillation -Continue beta-blockers for rate control -Currently on anticoagulation with heparin infusion -Echocardiogram shows preserved ejection fraction   Diabetes -Continue on sliding scale insulin -Blood sugars elevated on admission, he was only taking oral agents prior to admission -Blood sugars currently borderline elevated, will follow -Follow-up A1c 10.4%   Hypertension-stable -Chronically on lisinopril/hydrochlorothiazide -Holding now since he is n.p.o. -Use labetalol IV as needed   Hypokalemia -Replace  Delirium -With hallucinations -Monitor closely and start Seroquel tonight     DVT prophylaxis:   Heparin infusion   Code Status: DNR Family Communication: Updated son at the bedside 5/17 Disposition Plan: Status is: Inpatient Remains inpatient appropriate because: Remains altered mental status needing further work-up      Consultants:  Infectious disease   Procedures:   LP   Antimicrobials:  Anti-infectives (From admission, onward)    Start     Dose/Rate Route Frequency Ordered Stop   10/01/21 1004  cefTRIAXone (ROCEPHIN) 2 g in sodium chloride 0.9 % 100 mL IVPB        2 g 200 mL/hr over 30  Minutes Intravenous Every 24 hours 09/30/21 1341     09/29/21 1700  acyclovir (ZOVIRAX) 710 mg in dextrose 5 % 250 mL IVPB         10 mg/kg  70.9 kg 264.2 mL/hr over 60 Minutes Intravenous Every 8 hours 09/29/21 1503     09/29/21 1600  ampicillin (OMNIPEN) 2 g in sodium chloride 0.9 % 100 mL IVPB  Status:  Discontinued        2 g 300 mL/hr over 20 Minutes Intravenous Every 4 hours 09/29/21 1452 09/30/21 1341   09/29/21 1600  cefTRIAXone (ROCEPHIN) 2 g in sodium chloride 0.9 % 100 mL IVPB  Status:  Discontinued        2 g 200 mL/hr over 30 Minutes Intravenous Every 12 hours 09/29/21 1452 09/30/21 1341   09/29/21 1200  ceFEPIme (MAXIPIME) 2 g in sodium chloride 0.9 % 100 mL IVPB  Status:  Discontinued        2 g 200 mL/hr over 30 Minutes Intravenous Every 8 hours 09/29/21 1051 09/29/21 1452   09/29/21 0600  vancomycin (VANCOREADY) IVPB 750 mg/150 mL  Status:  Discontinued        750 mg 150 mL/hr over 60 Minutes Intravenous Every 12 hours 09/28/21 1555 09/30/21 1341   09/29/21 0500  ceFEPIme (MAXIPIME) 2 g in sodium chloride 0.9 % 100 mL IVPB  Status:  Discontinued        2 g 200 mL/hr over 30 Minutes Intravenous Every 12 hours 09/28/21 1555 09/29/21 1051   09/29/21 0300  metroNIDAZOLE (FLAGYL) IVPB 500 mg  Status:  Discontinued        500 mg 100 mL/hr over 60 Minutes Intravenous Every 12 hours 09/28/21 2156 09/29/21 1452   09/28/21 2200  ceFEPIme (MAXIPIME) 2 g in sodium chloride 0.9 % 100 mL IVPB  Status:  Discontinued        2 g 200 mL/hr over 30 Minutes Intravenous  Once 09/28/21 2156 09/28/21 2159   09/28/21 2200  vancomycin (VANCOCIN) IVPB 1000 mg/200 mL premix  Status:  Discontinued        1,000 mg 200 mL/hr over 60 Minutes Intravenous  Once 09/28/21 2156 09/28/21 2159   09/28/21 1600  vancomycin (VANCOREADY) IVPB 1500 mg/300 mL        1,500 mg 150 mL/hr over 120 Minutes Intravenous  Once 09/28/21 1550 09/28/21 1835   09/28/21 1530  ceFEPIme (MAXIPIME) 2 g in sodium chloride 0.9 % 100 mL IVPB        2 g 200 mL/hr over 30 Minutes Intravenous  Once 09/28/21 1523 09/28/21 1631   09/28/21 1530  vancomycin  (VANCOCIN) IVPB 1000 mg/200 mL premix  Status:  Discontinued        1,000 mg 200 mL/hr over 60 Minutes Intravenous  Once 09/28/21 1523 09/28/21 1550   09/28/21 1515  metroNIDAZOLE (FLAGYL) IVPB 500 mg        500 mg 100 mL/hr over 60 Minutes Intravenous  Once 09/28/21 1503 09/28/21 1630      Subjective: Patient seen and evaluated today with some delirium and hallucinations that are ongoing.  No acute overnight events noted.  Objective: Vitals:   10/02/21 0740 10/02/21 0800 10/02/21 0900 10/02/21 0933  BP:  (!) 165/66  (!) 144/60  Pulse: 95 88 (!) 101 98  Resp: '20 20 18 '$ (!) 27  Temp: 97.7 F (36.5 C)     TempSrc: Oral     SpO2: 100% 92% 93% 94%  Weight:  Height:        Intake/Output Summary (Last 24 hours) at 10/02/2021 1005 Last data filed at 10/02/2021 0938 Gross per 24 hour  Intake 2396.72 ml  Output 1750 ml  Net 646.72 ml   Filed Weights   09/30/21 0408 10/01/21 0500 10/02/21 0500  Weight: 74 kg 74.1 kg 77 kg    Examination:  General exam: Appears calm and comfortable, confused and delirious Respiratory system: Clear to auscultation. Respiratory effort normal. Cardiovascular system: S1 & S2 heard, RRR.  Gastrointestinal system: Abdomen is soft Central nervous system: Alert and awake Extremities: No edema Skin: No significant lesions noted Psychiatry: Flat affect.    Data Reviewed: I have personally reviewed following labs and imaging studies  CBC: Recent Labs  Lab 09/28/21 1518 09/29/21 0400 09/30/21 0557 10/01/21 0422 10/02/21 0435  WBC 14.2* 13.3* 13.0* 8.0 6.4  NEUTROABS 12.4*  --   --   --   --   HGB 16.8 15.8 14.6 13.8 12.8*  HCT 47.8 46.3 43.1 40.3 38.8*  MCV 93.0 95.7 96.9 96.4 95.3  PLT 210 171 143* 109* 932*   Basic Metabolic Panel: Recent Labs  Lab 09/28/21 1518 09/29/21 0400 09/30/21 0557 10/01/21 0422 10/02/21 0435  NA 139 145 146* 142 141  K 3.3* 3.4* 2.8* 3.1* 3.2*  CL 102 109 115* 114* 111  CO2 '22 27 26 24 23  '$ GLUCOSE  471* 399* 179* 206* 236*  BUN 21 24* 30* 18 12  CREATININE 1.00 0.85 0.72 0.47* 0.58*  CALCIUM 9.1 8.7* 8.4* 7.8* 8.0*  MG  --   --  1.7 1.6* 1.7   GFR: Estimated Creatinine Clearance: 74.8 mL/min (A) (by C-G formula based on SCr of 0.58 mg/dL (L)). Liver Function Tests: Recent Labs  Lab 09/28/21 1518 09/30/21 0557 10/01/21 0422  AST 44* 32 31  ALT '29 25 24  '$ ALKPHOS 84 53 54  BILITOT 1.6* 1.4* 0.9  PROT 8.1 6.1* 5.6*  ALBUMIN 4.5 3.2* 2.8*   No results for input(s): LIPASE, AMYLASE in the last 168 hours. Recent Labs  Lab 09/29/21 1728  AMMONIA 47*   Coagulation Profile: Recent Labs  Lab 09/28/21 1518 09/29/21 0400  INR 1.1 1.2   Cardiac Enzymes: No results for input(s): CKTOTAL, CKMB, CKMBINDEX, TROPONINI in the last 168 hours. BNP (last 3 results) No results for input(s): PROBNP in the last 8760 hours. HbA1C: Recent Labs    10/01/21 0422  HGBA1C 10.4*   CBG: Recent Labs  Lab 10/01/21 0732 10/01/21 1128 10/01/21 1628 10/01/21 2051 10/02/21 0737  GLUCAP 170* 168* 240* 184* 221*   Lipid Profile: No results for input(s): CHOL, HDL, LDLCALC, TRIG, CHOLHDL, LDLDIRECT in the last 72 hours. Thyroid Function Tests: No results for input(s): TSH, T4TOTAL, FREET4, T3FREE, THYROIDAB in the last 72 hours. Anemia Panel: No results for input(s): VITAMINB12, FOLATE, FERRITIN, TIBC, IRON, RETICCTPCT in the last 72 hours. Sepsis Labs: Recent Labs  Lab 09/28/21 1518 09/28/21 1848 09/29/21 1728  PROCALCITON  --  <0.10  --   LATICACIDVEN 4.9* 3.2* 1.5    Recent Results (from the past 240 hour(s))  Resp Panel by RT-PCR (Flu A&B, Covid) Nasopharyngeal Swab     Status: None   Collection Time: 09/28/21  3:02 PM   Specimen: Nasopharyngeal Swab; Nasopharyngeal(NP) swabs in vial transport medium  Result Value Ref Range Status   SARS Coronavirus 2 by RT PCR NEGATIVE NEGATIVE Final    Comment: (NOTE) SARS-CoV-2 target nucleic acids are NOT DETECTED.  The SARS-CoV-2  RNA is generally detectable in upper respiratory specimens during the acute phase of infection. The lowest concentration of SARS-CoV-2 viral copies this assay can detect is 138 copies/mL. A negative result does not preclude SARS-Cov-2 infection and should not be used as the sole basis for treatment or other patient management decisions. A negative result may occur with  improper specimen collection/handling, submission of specimen other than nasopharyngeal swab, presence of viral mutation(s) within the areas targeted by this assay, and inadequate number of viral copies(<138 copies/mL). A negative result must be combined with clinical observations, patient history, and epidemiological information. The expected result is Negative.  Fact Sheet for Patients:  EntrepreneurPulse.com.au  Fact Sheet for Healthcare Providers:  IncredibleEmployment.be  This test is no t yet approved or cleared by the [place] FDA and  has been authorized for detection and/or diagnosis of SARS-CoV-2 by FDA under an Emergency Use Authorization (EUA). This EUA will remain  in effect (meaning this test can be used) for the duration of the COVID-19 declaration under Section 564(b)(1) of the Act, 21 U.S.C.section 360bbb-3(b)(1), unless the authorization is terminated  or revoked sooner.       Influenza A by PCR NEGATIVE NEGATIVE Final   Influenza B by PCR NEGATIVE NEGATIVE Final    Comment: (NOTE) The Xpert Xpress SARS-CoV-2/FLU/RSV plus assay is intended as an aid in the diagnosis of influenza from Nasopharyngeal swab specimens and should not be used as a sole basis for treatment. Nasal washings and aspirates are unacceptable for Xpert Xpress SARS-CoV-2/FLU/RSV testing.  Fact Sheet for Patients: EntrepreneurPulse.com.au  Fact Sheet for Healthcare Providers: IncredibleEmployment.be  This test is not yet approved or cleared by the  [place] FDA and has been authorized for detection and/or diagnosis of SARS-CoV-2 by FDA under an Emergency Use Authorization (EUA). This EUA will remain in effect (meaning this test can be used) for the duration of the COVID-19 declaration under Section 564(b)(1) of the Act, 21 U.S.C. section 360bbb-3(b)(1), unless the authorization is terminated or revoked.  Performed at [facility], [address], [place], [place] [address]   Urine Culture     Status: Abnormal   Collection Time: 09/28/21  3:02 PM   Specimen: In/Out Cath Urine  Result Value Ref Range Status   Specimen Description   Final    IN/OUT CATH URINE Performed at [facility], [place], [address], [place], [place] [address]    Special Requests   Final    NONE Performed at [facility], [address], [place], [place] [address]    Culture MULTIPLE SPECIES PRESENT, SUGGEST RECOLLECTION (A)  Final   Report Status 09/30/2021 FINAL  Final  Culture, blood (Routine X 2) w Reflex to ID Panel     Status: None (Preliminary result)   Collection Time: 09/28/21  3:18 PM   Specimen: BLOOD  Result Value Ref Range Status   Specimen Description BLOOD BLOOD LEFT FOREARM  Final   Special Requests   Final    BOTTLES DRAWN AEROBIC AND ANAEROBIC Blood Culture adequate volume   Culture   Final    NO GROWTH 4 DAYS Performed at [facility], [address], [place],  [address]    Report Status PENDING  Incomplete  Culture, blood (Routine X 2) w Reflex to ID Panel     Status: None (Preliminary result)   Collection Time: 09/28/21  3:18 PM   Specimen: BLOOD  Result Value Ref Range Status   Specimen Description BLOOD RIGHT ANTECUBITAL  Final   Special  Requests   Final    BOTTLES DRAWN AEROBIC AND ANAEROBIC Blood Culture adequate volume   Culture   Final    NO GROWTH 4 DAYS Performed at [facility], [address] [place], [place] [address]    Report Status PENDING  Incomplete  MRSA Next Gen by PCR, Nasal     Status: None    Collection Time: 09/29/21 12:00 AM   Specimen: Nasal Mucosa; Nasal Swab  Result Value Ref Range Status   MRSA by PCR Next Gen NOT DETECTED NOT DETECTED Final    Comment: (NOTE) The GeneXpert MRSA Assay (FDA approved for NASAL specimens only), is one component of a comprehensive MRSA colonization surveillance program. It is not intended to diagnose MRSA infection nor to guide or monitor treatment for MRSA infections. Test performance is not FDA approved in patients less than 33 years old. Performed at [facility], [address] [place], [place] [address]   CSF culture w Gram Stain     Status: None (Preliminary result)   Collection Time: 09/30/21  9:36 AM   Specimen: CSF  Result Value Ref Range Status   Specimen Description   Final    CSF Performed at [facility], [address] [place], [place] [address]    Special Requests   Final    NONE Performed at [facility], [address], [place], [place] [address]    Gram Stain   Final    NO ORGANISMS SEEN WBC PRESENT,BOTH PMN AND MONONUCLEAR CYTOSPIN SMEAR   Culture   Final    NO GROWTH 2 DAYS Performed at [facility] [address] [place], [place] [address]    Report Status PENDING  Incomplete         Radiology Studies: DG CHEST PORT 1 VIEW  Result Date: 09/30/2021 CLINICAL DATA:  Fever EXAM: PORTABLE CHEST 1 VIEW COMPARISON:  Chest x-ray 09/28/2021 FINDINGS: Mild cardiomegaly unchanged. Mediastinum appears stable. Calcified plaques in the aortic arch. Mild emphysematous changes of the lungs. Linear likely subsegmental atelectasis/scarring at the lung bases. No pleural effusion or pneumothorax. IMPRESSION: Chronic changes with no acute process identified. Electronically Signed   By: Ofilia Neas M.D.   On: 09/30/2021 14:22        Scheduled Meds:  acetaminophen  650 mg Oral Once   Chlorhexidine Gluconate Cloth  6 each Topical Daily   insulin aspart  0-20 Units Subcutaneous TID WC   insulin aspart  0-5  Units Subcutaneous QHS   mouth rinse  15 mL Mouth Rinse q12n4p   QUEtiapine  25 mg Oral QHS   Continuous Infusions:  acyclovir 710 mg (10/02/21 0756)   cefTRIAXone (ROCEPHIN)  IV 2 g (10/02/21 0922)   heparin 1,200 Units/hr (10/02/21 0645)     LOS: 4 days    Time spent: 35 minutes    Kimberl Vig D Manuella Ghazi, DO [facility]  If 7PM-7AM, please contact night-coverage [email or web address] 10/02/2021, 10:05 AM

## 2021-10-02 NOTE — Progress Notes (Addendum)
ANTICOAGULATION CONSULT NOTE -   Pharmacy Consult for heparin--> lovenox Indication: pulmonary embolus  No Known Allergies  Patient Measurements: Height: '5\' 10"'$  (177.8 cm) Weight: 77 kg (169 lb 12.1 oz) IBW/kg (Calculated) : 73  Vital Signs: Temp: 98.3 F (36.8 C) (05/18 1220) Temp Source: Axillary (05/18 1220) BP: 127/73 (05/18 1200) Pulse Rate: 93 (05/18 1200)  Labs: Recent Labs    09/30/21 0557 09/30/21 2231 10/01/21 0422 10/01/21 0900 10/01/21 1800 10/02/21 0435  HGB 14.6  --  13.8  --   --  12.8*  HCT 43.1  --  40.3  --   --  38.8*  PLT 143*  --  109*  --   --  110*  HEPARINUNFRC  --    < > 0.25* 0.37 0.36 0.32  CREATININE 0.72  --  0.47*  --   --  0.58*   < > = values in this interval not displayed.     Estimated Creatinine Clearance: 74.8 mL/min (A) (by C-G formula based on SCr of 0.58 mg/dL (L)).   Medical History: Past Medical History:  Diagnosis Date   Diabetes ([place])    High cholesterol    Hypertension    Kidney stone     Medications:  Medications Prior to Admission  Medication Sig Dispense Refill Last Dose   acetaminophen (TYLENOL) 325 MG tablet Take 2 tablets (650 mg total) by mouth every 4 (four) hours as needed for mild pain (or temp > 37.5 C (99.5 F)). 12 tablet 0 Past Week   aspirin EC 81 MG EC tablet Take 1 tablet (81 mg total) by mouth daily with breakfast. Please take Aspirin 81 mg daily along with Plavix 75 mg daily for 21 days then after that STOP the Plavix  and continue ONLY Aspirin 81 mg daily indefinitely--for  stroke Prevention (Patient taking differently: Take 81 mg by mouth daily with breakfast.) 30 tablet 11 09/27/2021   glipiZIDE (GLUCOTROL) 10 MG tablet Take 1 tablet (10 mg total) by mouth 2 (two) times daily before a meal. 180 tablet 4 09/27/2021   indomethacin (INDOCIN) 50 MG capsule Take 50 mg by mouth 2 (two) times daily.   09/27/2021   lisinopril-hydrochlorothiazide (PRINZIDE,ZESTORETIC) 20-25 MG per tablet Take 1 tablet by  mouth daily.   09/27/2021   metFORMIN (GLUCOPHAGE) 1000 MG tablet Take 1 tablet (1,000 mg total) by mouth 2 (two) times daily with a meal. 180 tablet 4 09/27/2021   clopidogrel (PLAVIX) 75 MG tablet Take 1 tablet (75 mg total) by mouth daily. Please take Aspirin 81 mg daily along with Plavix 75 mg daily for 21 days then after that STOP the Plavix  and continue ONLY Aspirin 81 mg daily indefinitely--for  stroke Prevention (Patient not taking: Reported on 09/28/2021) 30 tablet 0 Completed Course   Scheduled:   acetaminophen  650 mg Oral Once   Chlorhexidine Gluconate Cloth  6 each Topical Daily   enoxaparin (LOVENOX) injection  80 mg Subcutaneous Q12H   insulin aspart  0-20 Units Subcutaneous TID WC   insulin aspart  0-5 Units Subcutaneous QHS   mouth rinse  15 mL Mouth Rinse q12n4p   QUEtiapine  25 mg Oral QHS   Infusions:   cefTRIAXone (ROCEPHIN)  IV 2 g (10/02/21 4268)    Assessment: 81yo male was found unresponsive by family >> started on ABX for sepsis, now w/ CT revealing mild PE in RUL >> to begin heparin. MD okay with transitioning to lovenox for tx   Goal of Therapy:  Heparin level 0.3-0.7 units/ml Monitor platelets by anticoagulation protocol: Yes   Plan:  D/C heparin Lovenox '80mg'$  ('1mg'$ /kg) sq q12h F/u transition to po tx Monitor CBC  Isac Sarna, BS Pharm D, BCPS Clinical Pharmacist 10/02/2021 12:48 PM

## 2021-10-02 NOTE — Plan of Care (Signed)
  Problem: Acute Rehab PT Goals(only PT should resolve) Goal: Pt Will Go Supine/Side To Sit Outcome: Progressing Flowsheets (Taken 10/02/2021 1537) Pt will go Supine/Side to Sit:  with minimal assist  with moderate assist Goal: Patient Will Transfer Sit To/From Stand Outcome: Progressing Flowsheets (Taken 10/02/2021 1537) Patient will transfer sit to/from stand:  with minimal assist  with moderate assist Goal: Pt Will Transfer Bed To Chair/Chair To Bed Outcome: Progressing Flowsheets (Taken 10/02/2021 1537) Pt will Transfer Bed to Chair/Chair to Bed: with mod assist Goal: Pt Will Ambulate Outcome: Progressing Flowsheets (Taken 10/02/2021 1537) Pt will Ambulate:  15 feet  with moderate assist  with rolling walker   3:38 PM, 10/02/21 Lonell Grandchild, MPT Physical Therapist with [facility] 336 [phone] office [phone] mobile phone

## 2021-10-02 NOTE — Progress Notes (Signed)
ANTICOAGULATION CONSULT NOTE -   Pharmacy Consult for heparin Indication: pulmonary embolus  No Known Allergies  Patient Measurements: Height: '5\' 10"'$  (177.8 cm) Weight: 77 kg (169 lb 12.1 oz) IBW/kg (Calculated) : 73  Vital Signs: Temp: 97.7 F (36.5 C) (05/18 0740) Temp Source: Oral (05/18 0740) BP: 163/66 (05/18 0730) Pulse Rate: 87 (05/18 0737)  Labs: Recent Labs    09/30/21 0557 09/30/21 2231 10/01/21 0422 10/01/21 0900 10/01/21 1800 10/02/21 0435  HGB 14.6  --  13.8  --   --  12.8*  HCT 43.1  --  40.3  --   --  38.8*  PLT 143*  --  109*  --   --  110*  HEPARINUNFRC  --    < > 0.25* 0.37 0.36 0.32  CREATININE 0.72  --  0.47*  --   --  0.58*   < > = values in this interval not displayed.     Estimated Creatinine Clearance: 74.8 mL/min (A) (by C-G formula based on SCr of 0.58 mg/dL (L)).   Medical History: Past Medical History:  Diagnosis Date   Diabetes ([place])    High cholesterol    Hypertension    Kidney stone     Medications:  Medications Prior to Admission  Medication Sig Dispense Refill Last Dose   acetaminophen (TYLENOL) 325 MG tablet Take 2 tablets (650 mg total) by mouth every 4 (four) hours as needed for mild pain (or temp > 37.5 C (99.5 F)). 12 tablet 0 Past Week   aspirin EC 81 MG EC tablet Take 1 tablet (81 mg total) by mouth daily with breakfast. Please take Aspirin 81 mg daily along with Plavix 75 mg daily for 21 days then after that STOP the Plavix  and continue ONLY Aspirin 81 mg daily indefinitely--for  stroke Prevention (Patient taking differently: Take 81 mg by mouth daily with breakfast.) 30 tablet 11 09/27/2021   glipiZIDE (GLUCOTROL) 10 MG tablet Take 1 tablet (10 mg total) by mouth 2 (two) times daily before a meal. 180 tablet 4 09/27/2021   indomethacin (INDOCIN) 50 MG capsule Take 50 mg by mouth 2 (two) times daily.   09/27/2021   lisinopril-hydrochlorothiazide (PRINZIDE,ZESTORETIC) 20-25 MG per tablet Take 1 tablet by mouth daily.    09/27/2021   metFORMIN (GLUCOPHAGE) 1000 MG tablet Take 1 tablet (1,000 mg total) by mouth 2 (two) times daily with a meal. 180 tablet 4 09/27/2021   clopidogrel (PLAVIX) 75 MG tablet Take 1 tablet (75 mg total) by mouth daily. Please take Aspirin 81 mg daily along with Plavix 75 mg daily for 21 days then after that STOP the Plavix  and continue ONLY Aspirin 81 mg daily indefinitely--for  stroke Prevention (Patient not taking: Reported on 09/28/2021) 30 tablet 0 Completed Course   Scheduled:   acetaminophen  650 mg Oral Once   Chlorhexidine Gluconate Cloth  6 each Topical Daily   insulin aspart  0-20 Units Subcutaneous TID WC   insulin aspart  0-5 Units Subcutaneous QHS   mouth rinse  15 mL Mouth Rinse q12n4p   Infusions:   acyclovir 710 mg (10/02/21 0756)   cefTRIAXone (ROCEPHIN)  IV Stopped (10/01/21 1110)   heparin 1,200 Units/hr (10/02/21 0645)    Assessment: 81yo male was found unresponsive by family >> started on ABX for sepsis, now w/ CT revealing mild PE in RUL >> to begin heparin.  HL 0.32- therapeutic  Goal of Therapy:  Heparin level 0.3-0.7 units/ml Monitor platelets by anticoagulation protocol: Yes  Plan:  Continue heparin infusion at 1200 units/hr  Heparin level  daily Monitor CBC  Isac Sarna, BS Pharm D, BCPS Clinical Pharmacist 10/02/2021 9:00 AM

## 2021-10-02 NOTE — NC FL2 (Signed)
[place] LEVEL OF CARE SCREENING TOOL     IDENTIFICATION  Patient Name: Calvin Mills Birthdate: 03/24/1941 Sex: male Admission Date (Current Location): 09/28/2021  [facility] and [place] Number:  [employer] and Address:  [place] [address] [place]      Provider Number: [id number]  Attending Physician Name and Address:  Rodena Goldmann, DO  Relative Name and Phone Number:  Damarrion Mimbs [facility])  [phone]    Current Level of Care: Hospital Recommended Level of Care: [place] Prior Approval Number:    Date Approved/Denied:   PASRR Number: [id number]  Discharge Plan: SNF    Current Diagnoses: Patient Active Problem List   Diagnosis Date Noted   Acute pulmonary embolus ([place]) 09/29/2021   Acute respiratory failure with hypoxia ([place]) 09/29/2021   Uncontrolled type 2 diabetes mellitus with hyperglycemia, with long-term current use of insulin ([place]) 09/29/2021   Hypokalemia 09/29/2021   Atrial fibrillation with rapid ventricular response ([place]) 09/29/2021   Sepsis ([place]) 09/28/2021   Left-sided weakness 05/28/2021   Diabetes ([place]) 04/04/2014   Essential hypertension 04/04/2014   Hyperlipidemia 04/04/2014   History of colonic polyps 04/04/2014    Orientation RESPIRATION BLADDER Height & Weight     Self  Normal Continent Weight: 77 kg Height:  '5\' 10"'$  (177.8 cm)  BEHAVIORAL SYMPTOMS/MOOD NEUROLOGICAL BOWEL NUTRITION STATUS      Continent Diet (See DC summary)  AMBULATORY STATUS COMMUNICATION OF NEEDS Skin   Extensive Assist Verbally Normal                       Personal Care Assistance Level of Assistance  Bathing, Feeding, Dressing Bathing Assistance: Maximum assistance Feeding assistance: Limited assistance Dressing Assistance: Maximum assistance     Functional Limitations Info  Sight, Hearing, Speech Sight Info: Impaired Hearing Info: Impaired Speech Info: Adequate    SPECIAL CARE  FACTORS FREQUENCY  PT (By licensed PT)     PT Frequency: 5 times a week              Contractures Contractures Info: Not present    Additional Factors Info  Code Status, Allergies Code Status Info: DNR Allergies Info: NKDA           Current Medications (10/02/2021):  This is the current hospital active medication list Current Facility-Administered Medications  Medication Dose Route Frequency Provider Last Rate Last Admin   acetaminophen (TYLENOL) tablet 650 mg  650 mg Oral Q6H PRN Mansy, Jan A, MD       Or   acetaminophen (TYLENOL) suppository 650 mg  650 mg Rectal Q6H PRN Mansy, Jan A, MD   650 mg at 09/29/21 0412   acetaminophen (TYLENOL) tablet 650 mg  650 mg Oral Once Mansy, Jan A, MD       cefTRIAXone (ROCEPHIN) 2 g in sodium chloride 0.9 % 100 mL IVPB  2 g Intravenous Q24H Kathie Dike, MD   Stopped at 10/02/21 0952   Chlorhexidine Gluconate Cloth 2 % PADS 6 each  6 each Topical Daily Mansy, Jan A, MD   6 each at 10/02/21 0819   enoxaparin (LOVENOX) injection 80 mg  80 mg Subcutaneous Q12H Shah, Pratik D, DO   80 mg at 10/02/21 1306   hydrALAZINE (APRESOLINE) injection 10 mg  10 mg Intravenous Q6H PRN Mansy, Jan A, MD   10 mg at 10/02/21 0421   insulin aspart (novoLOG) injection 0-20 Units  0-20  Units Subcutaneous TID WC Manuella Ghazi, Pratik D, DO   4 Units at 10/02/21 1156   insulin aspart (novoLOG) injection 0-5 Units  0-5 Units Subcutaneous QHS Manuella Ghazi, Pratik D, DO       labetalol (NORMODYNE) injection 20 mg  20 mg Intravenous Q3H PRN Mansy, Jan A, MD   20 mg at 10/02/21 0009   ondansetron (ZOFRAN) tablet 4 mg  4 mg Oral Q6H PRN Mansy, Jan A, MD       Or   ondansetron [facility]) injection 4 mg  4 mg Intravenous Q6H PRN Mansy, Jan A, MD       QUEtiapine (SEROQUEL) tablet 25 mg  25 mg Oral QHS Shah, Pratik D, DO       traZODone (DESYREL) tablet 25 mg  25 mg Oral QHS PRN Mansy, Arvella Merles, MD         Discharge Medications: Please see discharge summary for a list of discharge  medications.  Relevant Imaging Results:  Relevant Lab Results:   Additional Information SS# [id number]  Boneta Lucks, RN

## 2021-10-02 NOTE — Progress Notes (Signed)
Brief ID Note:  Patients HSV PCR from CSF was negative.  Will stop acyclovir.  Will plan for empiric treatment given initial sepsis concern on admission with ceftriaxone x 7 days stopping after 5/20 dose.    Please call as needed.    Raynelle Highland for Infectious Disease [facility] 10/02/2021, 2:34 PM

## 2021-10-02 NOTE — Evaluation (Signed)
Physical Therapy Evaluation Patient Details Name: KIMBERLY COYE MRN: [id number] DOB: September 15, 1940 Today's Date: 10/02/2021  History of Present Illness  DAVEN MONTZ is a 81 y.o. Caucasian male with medical history significant for type 2 diabetes mellitus, hypertension, dyslipidemia and urolithiasis, who presented to the emergency room with acute onset of altered mental status with unresponsiveness.  The patient's son who closely attentive his father has been monitoring him with a ring camera which stopped giving any drinks from 1 PM when they went to his home and found him in the bathroom tub covered in feces and was unresponsive and later lethargic.  He was noted to be hypoxic by EMS as well and was placed on O2 that was up to 4 L/min.  On room air his pulse oximetry was down to 88%.  No reported chest pain or palpitations or cough or wheezing.  No reported urinary symptoms.  The patient was very lethargic and barely arousable and therefore no history could be obtained from the patient.  Most of the history was obtained from the patient's son.   Clinical Impression  Patient requires increased time and frequent rest breaks for sitting up at bedside due to generalized weakness, very unsteady on feet with frequent loss of balance and required multiple attempts before able to transfer to chair due to buckling of knees.  Patient tolerated sitting up in chair after therapy with family members present in room.  Patient will benefit from continued skilled physical therapy in hospital and recommended venue below to increase strength, balance, endurance for safe ADLs and gait.         Recommendations for follow up therapy are one component of a multi-disciplinary discharge planning process, led by the attending physician.  Recommendations may be updated based on patient status, additional functional criteria and insurance authorization.  Follow Up Recommendations Skilled nursing-short term rehab (<3 hours/day)     Assistance Recommended at Discharge Frequent or constant Supervision/Assistance  Patient can return home with the following  A lot of help with bathing/dressing/bathroom;A lot of help with walking and/or transfers;Help with stairs or ramp for entrance;Assistance with cooking/housework    Equipment Recommendations None recommended by PT  Recommendations for Other Services       Functional Status Assessment Patient has had a recent decline in their functional status and demonstrates the ability to make significant improvements in function in a reasonable and predictable amount of time.     Precautions / Restrictions Precautions Precautions: Fall Restrictions Weight Bearing Restrictions: No      Mobility  Bed Mobility Overal bed mobility: Needs Assistance Bed Mobility: Supine to Sit     Supine to sit: Mod assist     General bed mobility comments: increased time, labored movement    Transfers Overall transfer level: Needs assistance Equipment used: Rolling walker (2 wheels) Transfers: Sit to/from Stand, Bed to chair/wheelchair/BSC Sit to Stand: Mod assist   Step pivot transfers: Mod assist, Max assist       General transfer comment: slow labored movement with frequent buckling of knees    Ambulation/Gait Ambulation/Gait assistance: Mod assist, Max assist Gait Distance (Feet): 4 Feet Assistive device: Rolling walker (2 wheels) Gait Pattern/deviations: Decreased step length - left, Decreased step length - right, Decreased stride length, Trunk flexed, Knees buckling Gait velocity: decreased     General Gait Details: limited to a few slow labored short side steps with frequent buckling of knees due to weakness and poor standing balance  Stairs  Wheelchair Mobility    Modified Rankin (Stroke Patients Only)       Balance Overall balance assessment: Needs assistance Sitting-balance support: Feet supported, No upper extremity supported Sitting  balance-Leahy Scale: Fair Sitting balance - Comments: seated at EOB   Standing balance support: During functional activity, Bilateral upper extremity supported, Reliant on assistive device for balance Standing balance-Leahy Scale: Poor Standing balance comment: using RW                             Pertinent Vitals/Pain Pain Assessment Pain Assessment: Faces Faces Pain Scale: Hurts a little bit Pain Location: BLE Pain Descriptors / Indicators: Sore Pain Intervention(s): Limited activity within patient's tolerance, Monitored during session, Repositioned    Home Living Family/patient expects to be discharged to:: Private residence Living Arrangements: Alone Available Help at Discharge: Family;Available PRN/intermittently Type of Home: House Home Access: Stairs to enter Entrance Stairs-Rails: Right;Left;Can reach both Entrance Stairs-Number of Steps: 3   Home Layout: One level Home Equipment: Conservation officer, nature (2 wheels);Cane - single point Additional Comments: family uses ring camera to check on patient per chart    Prior Function Prior Level of Function : Independent/Modified Independent             Mobility Comments: household and short distanced community ambulator without AD, drives occasionally ADLs Comments: assisted for community ADLs by family     Hand Dominance        Extremity/Trunk Assessment   Upper Extremity Assessment Upper Extremity Assessment: Generalized weakness    Lower Extremity Assessment Lower Extremity Assessment: Generalized weakness    Cervical / Trunk Assessment Cervical / Trunk Assessment: Normal  Communication   Communication: No difficulties  Cognition Arousal/Alertness: Awake/alert Behavior During Therapy: WFL for tasks assessed/performed, Flat affect Overall Cognitive Status: History of cognitive impairments - at baseline                                 General Comments: slightly confused, but able to  follow instructions with repeated verbal/tactile cueing        General Comments      Exercises     Assessment/Plan    PT Assessment Patient needs continued PT services  PT Problem List Decreased strength;Decreased activity tolerance;Decreased balance;Decreased mobility       PT Treatment Interventions DME instruction;Gait training;Stair training;Functional mobility training;Therapeutic activities;Patient/family education;Balance training    PT Goals (Current goals can be found in the Care Plan section)  Acute Rehab PT Goals Patient Stated Goal: return home after rehab PT Goal Formulation: With patient/family Time For Goal Achievement: 10/16/21 Potential to Achieve Goals: Good    Frequency Min 3X/week     Co-evaluation               AM-PAC PT "6 Clicks" Mobility  Outcome Measure Help needed turning from your back to your side while in a flat bed without using bedrails?: A Lot Help needed moving from lying on your back to sitting on the side of a flat bed without using bedrails?: A Lot Help needed moving to and from a bed to a chair (including a wheelchair)?: A Lot Help needed standing up from a chair using your arms (e.g., wheelchair or bedside chair)?: A Lot Help needed to walk in hospital room?: A Lot Help needed climbing 3-5 steps with a railing? : Total 6 Click Score: 11    End  of Session   Activity Tolerance: Patient tolerated treatment well;Patient limited by fatigue Patient left: in chair;with call bell/phone within reach;with family/visitor present;with chair alarm set Nurse Communication: Mobility status PT Visit Diagnosis: Unsteadiness on feet (R26.81);Other abnormalities of gait and mobility (R26.89);Muscle weakness (generalized) (M62.81)    Time: 1833-5825 PT Time Calculation (min) (ACUTE ONLY): 29 min   Charges:   PT Evaluation $PT Eval Moderate Complexity: 1 Mod PT Treatments $Therapeutic Activity: 23-37 mins        3:36 PM,  10/02/21 Lonell Grandchild, MPT Physical Therapist with [place] 336 [phone] office [phone] mobile phone

## 2021-10-02 NOTE — TOC Initial Note (Signed)
Transition of Care [facility]) - Initial/Assessment Note    Patient Details  Name: Calvin Mills MRN: [id number] Date of Birth: 08-20-1940  [facility] CM/SW Contact:    Boneta Lucks, RN Phone Number: 10/02/2021, 3:19 PM  Clinical Narrative:         Patient admitted with sepsis. TOC spoke with his son, Calvin Mills. At baseline patient lives alone and drives. PT is recommending SNF. Family is agreeable. They prefer an [facility].  FL2 completed and sent out.   Expected Discharge Plan: Skilled Nursing Facility Barriers to Discharge: Continued Medical Work up  Patient Goals and CMS Choice Patient states their goals for this hospitalization and ongoing recovery are:: agreeable to SNF CMS Medicare.gov Compare Post Acute Care list provided to:: Patient Represenative (must comment) Choice offered to / list presented to : Adult Children  Expected Discharge Plan and Services Expected Discharge Plan: [place] arrangements for the past 2 months: Single Family Home                   Prior Living Arrangements/Services Living arrangements for the past 2 months: Single Family Home Lives with:: Self Patient language and need for interpreter reviewed:: Yes Do you feel safe going back to the place where you live?: Yes      Need for Family Participation in Patient Care: Yes (Comment) Care giver support system in place?: Yes (comment)   Criminal Activity/Legal Involvement Pertinent to Current Situation/Hospitalization: No - Comment as needed  Activities of Daily Living Home Assistive Devices/Equipment: None ADL Screening (condition at time of admission) Patient's cognitive ability adequate to safely complete daily activities?: No Is the patient deaf or have difficulty hearing?: No Does the patient have difficulty seeing, even when wearing glasses/contacts?: No Does the patient have difficulty concentrating, remembering, or making decisions?: Yes Patient able to  express need for assistance with ADLs?: Yes Does the patient have difficulty dressing or bathing?: No Independently performs ADLs?: Yes (appropriate for developmental age) Does the patient have difficulty walking or climbing stairs?: No Weakness of Legs: Both Weakness of Arms/Hands: None   Emotional Assessment      Orientation: : Oriented to Self Alcohol / Substance Use: Not Applicable Psych Involvement: No (comment)  Admission diagnosis:  Sepsis ([place]) [A41.9] Sepsis with encephalopathy without septic shock, due to unspecified organism ([place]) [A41.9, R65.20, G93.40] Patient Active Problem List   Diagnosis Date Noted   Acute pulmonary embolus ([place]) 09/29/2021   Acute respiratory failure with hypoxia ([place]) 09/29/2021   Uncontrolled type 2 diabetes mellitus with hyperglycemia, with long-term current use of insulin ([place]) 09/29/2021   Hypokalemia 09/29/2021   Atrial fibrillation with rapid ventricular response ([place]) 09/29/2021   Sepsis ([place]) 09/28/2021   Left-sided weakness 05/28/2021   Diabetes ([place]) 04/04/2014   Essential hypertension 04/04/2014   Hyperlipidemia 04/04/2014   History of colonic polyps 04/04/2014   PCP:  Redmond School, MD Pharmacy:   [facility] [place] [address] [place] [address] Phone: [phone] Fax: [phone]   Readmission Risk Interventions    10/02/2021    3:18 PM  Readmission Risk Prevention Plan  Post Dischage Appt Not Complete  Medication Screening Complete  Transportation Screening Complete

## 2021-10-02 NOTE — Progress Notes (Signed)
Pt attempting to get out of bed, very confused, combative and anxious. MD made aware, new orders for PRN Haldol '2mg'$  administered. Safety observation ordered, NT to be assigned to sit. Family at bedside at this time.

## 2021-10-03 DIAGNOSIS — R6520 Severe sepsis without septic shock: Secondary | ICD-10-CM | POA: Diagnosis not present

## 2021-10-03 DIAGNOSIS — R410 Disorientation, unspecified: Secondary | ICD-10-CM

## 2021-10-03 DIAGNOSIS — A419 Sepsis, unspecified organism: Secondary | ICD-10-CM | POA: Diagnosis not present

## 2021-10-03 DIAGNOSIS — G9340 Encephalopathy, unspecified: Secondary | ICD-10-CM | POA: Diagnosis not present

## 2021-10-03 LAB — CBC
HCT: 40.2 % (ref 39.0–52.0)
Hemoglobin: 13.2 g/dL (ref 13.0–17.0)
MCH: 31.4 pg (ref 26.0–34.0)
MCHC: 32.8 g/dL (ref 30.0–36.0)
MCV: 95.5 fL (ref 80.0–100.0)
Platelets: 128 10*3/uL — ABNORMAL LOW (ref 150–400)
RBC: 4.21 MIL/uL — ABNORMAL LOW (ref 4.22–5.81)
RDW: 13 % (ref 11.5–15.5)
WBC: 8.5 10*3/uL (ref 4.0–10.5)
nRBC: 0 % (ref 0.0–0.2)

## 2021-10-03 LAB — CSF CULTURE W GRAM STAIN: Culture: NO GROWTH

## 2021-10-03 LAB — BASIC METABOLIC PANEL
Anion gap: 9 (ref 5–15)
BUN: 8 mg/dL (ref 8–23)
CO2: 23 mmol/L (ref 22–32)
Calcium: 8.2 mg/dL — ABNORMAL LOW (ref 8.9–10.3)
Chloride: 111 mmol/L (ref 98–111)
Creatinine, Ser: 0.56 mg/dL — ABNORMAL LOW (ref 0.61–1.24)
GFR, Estimated: >60 mL/min (ref >60)
Glucose, Bld: 163 mg/dL — ABNORMAL HIGH (ref 70–99)
Potassium: 2.9 mmol/L — ABNORMAL LOW (ref 3.5–5.1)
Sodium: 143 mmol/L (ref 135–145)

## 2021-10-03 LAB — CULTURE, BLOOD (ROUTINE X 2)
Culture: NO GROWTH
Culture: NO GROWTH
Special Requests: ADEQUATE
Special Requests: ADEQUATE

## 2021-10-03 LAB — GLUCOSE, CAPILLARY
Glucose-Capillary: 170 mg/dL — ABNORMAL HIGH (ref 70–99)
Glucose-Capillary: 149 mg/dL — ABNORMAL HIGH (ref 70–99)
Glucose-Capillary: 83 mg/dL (ref 70–99)
Glucose-Capillary: 164 mg/dL — ABNORMAL HIGH (ref 70–99)

## 2021-10-03 LAB — MAGNESIUM: Magnesium: 1.5 mg/dL — ABNORMAL LOW (ref 1.7–2.4)

## 2021-10-03 MED ORDER — POTASSIUM CHLORIDE CRYS ER 20 MEQ PO TBCR
40.0000 meq | EXTENDED_RELEASE_TABLET | Freq: Once | ORAL | Status: AC
  Administered 2021-10-03: 40 meq via ORAL
  Filled 2021-10-03: qty 2

## 2021-10-03 MED ORDER — POTASSIUM CHLORIDE 10 MEQ/100ML IV SOLN
10.0000 meq | INTRAVENOUS | Status: AC
  Administered 2021-10-03 (×4): 10 meq via INTRAVENOUS
  Filled 2021-10-03 (×3): qty 100

## 2021-10-03 MED ORDER — MAGNESIUM SULFATE 2 GM/50ML IV SOLN
2.0000 g | Freq: Once | INTRAVENOUS | Status: AC
  Administered 2021-10-03: 2 g via INTRAVENOUS
  Filled 2021-10-03: qty 50

## 2021-10-03 NOTE — Progress Notes (Addendum)
PROGRESS NOTE    Calvin Mills  IRS:[id number] DOB: Apr 22, 1941 DOA: 09/28/2021 PCP: Redmond School, MD   Brief Narrative:    81 year old male who was in his usual state of health on 5/13, was found by his son with severe altered mental status/unresponsive on 5/14.  He was brought to the ER where he was noted to be febrile and a temperature of 103 F.  There was no clear source of infection.  Started on intravenous antibiotics.  Further infectious work-up is underway.  Also noted to have right-sided pulmonary emboli, started on anticoagulation.  He continues to have ongoing symptoms of delirium.  Assessment & Plan:   Principal Problem:   Sepsis ([place]) Active Problems:   Acute pulmonary embolus (HCC)   Acute respiratory failure with hypoxia (HCC)   Uncontrolled type 2 diabetes mellitus with hyperglycemia, with long-term current use of insulin (HCC)   Atrial fibrillation with rapid ventricular response (HCC)   Essential hypertension   Hypokalemia   Acute delirium  Assessment and Plan:   Sepsis, present on admission -Etiology is unclear, although concern for CNS infection -Lactic acid noted to be elevated on admission, improved with IV fluids -Noted to be febrile, tachycardic, tachypneic on admission -Blood cultures have not shown any growth as of yet -He has been started on intravenous antibiotics -Urinalysis is not convincing for infection -Chest x-ray/CT did not underlying pneumonia -Infectious disease following -Lumbar puncture today with initial results not indicating overwhelming infectious process -Culture results negative   Acute encephalopathy, with delirium -Related to sepsis, fever 103F on admission -MRI brain did not show any acute findings -EEG did not show any epileptiform discharges -ABG did not show any hypercarbia -Ammonia only minimally elevated -No recent changes in medication -He is not on any sedatives at home, he is not on SSRIs -Change in mental status  was relatively sudden, was last in usual state of health 5/13, and by 5/14 he was unresponsive -Lumbar puncture done 5/16 with initial studies not terribly convincing of underlying infection -Continue on Rocephin through 5/20 and acyclovir discontinued -Initial Gram stain results with no significant findings and culture pending   Acute pulmonary embolus -Have right-sided PE on CT imaging -Started on heparin infusion, pharmacy will transition to Lovenox -Echo without evidence of right-sided heart strain -Venous Dopplers of lower extremities negative for DVT -We will eventually transition to DOAC when condition stabilizes   Atrial fibrillation -Continue beta-blockers for rate control -Currently on anticoagulation with heparin infusion -Echocardiogram shows preserved ejection fraction   Diabetes -Continue on sliding scale insulin -Blood sugars elevated on admission, he was only taking oral agents prior to admission -Blood sugars currently borderline elevated, will follow -Follow-up A1c 10.4%   Hypertension-stable -Chronically on lisinopril/hydrochlorothiazide -Holding now since he is n.p.o. -Use labetalol IV as needed   Hypokalemia/hypomagnesemia -Replace   Acute delirium -With hallucinations -Monitor closely and started Seroquel 5/18 -Requiring Haldol intermittently     DVT prophylaxis:   Full dose Lovenox   Code Status: DNR Family Communication: Updated son at the bedside 5/19 Disposition Plan: Status is: Inpatient Remains inpatient appropriate because: Remains with acute delirium and altered mentation.      Consultants:  Infectious disease   Procedures:   LP   Antimicrobials:  Anti-infectives (From admission, onward)    Start     Dose/Rate Route Frequency Ordered Stop   10/01/21 1004  cefTRIAXone (ROCEPHIN) 2 g in sodium chloride 0.9 % 100 mL IVPB        2 g  200 mL/hr over 30 Minutes Intravenous Every 24 hours 09/30/21 1341 10/03/21 1045   09/29/21 1700   acyclovir (ZOVIRAX) 710 mg in dextrose 5 % 250 mL IVPB  Status:  Discontinued        10 mg/kg  70.9 kg 264.2 mL/hr over 60 Minutes Intravenous Every 8 hours 09/29/21 1503 10/02/21 1245   09/29/21 1600  ampicillin (OMNIPEN) 2 g in sodium chloride 0.9 % 100 mL IVPB  Status:  Discontinued        2 g 300 mL/hr over 20 Minutes Intravenous Every 4 hours 09/29/21 1452 09/30/21 1341   09/29/21 1600  cefTRIAXone (ROCEPHIN) 2 g in sodium chloride 0.9 % 100 mL IVPB  Status:  Discontinued        2 g 200 mL/hr over 30 Minutes Intravenous Every 12 hours 09/29/21 1452 09/30/21 1341   09/29/21 1200  ceFEPIme (MAXIPIME) 2 g in sodium chloride 0.9 % 100 mL IVPB  Status:  Discontinued        2 g 200 mL/hr over 30 Minutes Intravenous Every 8 hours 09/29/21 1051 09/29/21 1452   09/29/21 0600  vancomycin (VANCOREADY) IVPB 750 mg/150 mL  Status:  Discontinued        750 mg 150 mL/hr over 60 Minutes Intravenous Every 12 hours 09/28/21 1555 09/30/21 1341   09/29/21 0500  ceFEPIme (MAXIPIME) 2 g in sodium chloride 0.9 % 100 mL IVPB  Status:  Discontinued        2 g 200 mL/hr over 30 Minutes Intravenous Every 12 hours 09/28/21 1555 09/29/21 1051   09/29/21 0300  metroNIDAZOLE (FLAGYL) IVPB 500 mg  Status:  Discontinued        500 mg 100 mL/hr over 60 Minutes Intravenous Every 12 hours 09/28/21 2156 09/29/21 1452   09/28/21 2200  ceFEPIme (MAXIPIME) 2 g in sodium chloride 0.9 % 100 mL IVPB  Status:  Discontinued        2 g 200 mL/hr over 30 Minutes Intravenous  Once 09/28/21 2156 09/28/21 2159   09/28/21 2200  vancomycin (VANCOCIN) IVPB 1000 mg/200 mL premix  Status:  Discontinued        1,000 mg 200 mL/hr over 60 Minutes Intravenous  Once 09/28/21 2156 09/28/21 2159   09/28/21 1600  vancomycin (VANCOREADY) IVPB 1500 mg/300 mL        1,500 mg 150 mL/hr over 120 Minutes Intravenous  Once 09/28/21 1550 09/28/21 1835   09/28/21 1530  ceFEPIme (MAXIPIME) 2 g in sodium chloride 0.9 % 100 mL IVPB        2 g 200 mL/hr  over 30 Minutes Intravenous  Once 09/28/21 1523 09/28/21 1631   09/28/21 1530  vancomycin (VANCOCIN) IVPB 1000 mg/200 mL premix  Status:  Discontinued        1,000 mg 200 mL/hr over 60 Minutes Intravenous  Once 09/28/21 1523 09/28/21 1550   09/28/21 1515  metroNIDAZOLE (FLAGYL) IVPB 500 mg        500 mg 100 mL/hr over 60 Minutes Intravenous  Once 09/28/21 1503 09/28/21 1630       Subjective: Patient seen and evaluated today with ongoing delirium/confusion.  Son at bedside.  Objective: Vitals:   10/02/21 1500 10/02/21 2045 10/03/21 0532 10/03/21 1330  BP: (!) 164/91 (!) 167/83 (!) 138/91 (!) 167/119  Pulse: 85 99 (!) 110 100  Resp:  20 20   Temp: 98.1 F (36.7 C) 98.7 F (37.1 C) 98.6 F (37 C)   TempSrc: Oral Oral    SpO2:  100% 100% 93% 95%  Weight:      Height:        Intake/Output Summary (Last 24 hours) at 10/03/2021 1354 Last data filed at 10/03/2021 0900 Gross per 24 hour  Intake 480 ml  Output 2550 ml  Net -2070 ml   Filed Weights   09/30/21 0408 10/01/21 0500 10/02/21 0500  Weight: 74 kg 74.1 kg 77 kg    Examination:  General exam: Appears confused Respiratory system: Clear to auscultation. Respiratory effort normal. Cardiovascular system: S1 & S2 heard, RRR.  Gastrointestinal system: Abdomen is soft Central nervous system: Alert and confused Extremities: No edema Skin: No significant lesions noted Psychiatry: Flat affect.    Data Reviewed: I have personally reviewed following labs and imaging studies  CBC: Recent Labs  Lab 09/28/21 1518 09/29/21 0400 09/30/21 0557 10/01/21 0422 10/02/21 0435 10/03/21 0508  WBC 14.2* 13.3* 13.0* 8.0 6.4 8.5  NEUTROABS 12.4*  --   --   --   --   --   HGB 16.8 15.8 14.6 13.8 12.8* 13.2  HCT 47.8 46.3 43.1 40.3 38.8* 40.2  MCV 93.0 95.7 96.9 96.4 95.3 95.5  PLT 210 171 143* 109* 110* 497*   Basic Metabolic Panel: Recent Labs  Lab 09/29/21 0400 09/30/21 0557 10/01/21 0422 10/02/21 0435 10/03/21 0508   NA 145 146* 142 141 143  K 3.4* 2.8* 3.1* 3.2* 2.9*  CL 109 115* 114* 111 111  CO2 '27 26 24 23 23  '$ GLUCOSE 399* 179* 206* 236* 163*  BUN 24* 30* '18 12 8  '$ CREATININE 0.85 0.72 0.47* 0.58* 0.56*  CALCIUM 8.7* 8.4* 7.8* 8.0* 8.2*  MG  --  1.7 1.6* 1.7 1.5*   GFR: Estimated Creatinine Clearance: 74.8 mL/min (A) (by C-G formula based on SCr of 0.56 mg/dL (L)). Liver Function Tests: Recent Labs  Lab 09/28/21 1518 09/30/21 0557 10/01/21 0422  AST 44* 32 31  ALT '29 25 24  '$ ALKPHOS 84 53 54  BILITOT 1.6* 1.4* 0.9  PROT 8.1 6.1* 5.6*  ALBUMIN 4.5 3.2* 2.8*   No results for input(s): LIPASE, AMYLASE in the last 168 hours. Recent Labs  Lab 09/29/21 1728  AMMONIA 47*   Coagulation Profile: Recent Labs  Lab 09/28/21 1518 09/29/21 0400  INR 1.1 1.2   Cardiac Enzymes: No results for input(s): CKTOTAL, CKMB, CKMBINDEX, TROPONINI in the last 168 hours. BNP (last 3 results) No results for input(s): PROBNP in the last 8760 hours. HbA1C: Recent Labs    10/01/21 0422  HGBA1C 10.4*   CBG: Recent Labs  Lab 10/02/21 1138 10/02/21 1749 10/02/21 2042 10/03/21 0759 10/03/21 1124  GLUCAP 199* 170* 108* 170* 149*   Lipid Profile: No results for input(s): CHOL, HDL, LDLCALC, TRIG, CHOLHDL, LDLDIRECT in the last 72 hours. Thyroid Function Tests: No results for input(s): TSH, T4TOTAL, FREET4, T3FREE, THYROIDAB in the last 72 hours. Anemia Panel: No results for input(s): VITAMINB12, FOLATE, FERRITIN, TIBC, IRON, RETICCTPCT in the last 72 hours. Sepsis Labs: Recent Labs  Lab 09/28/21 1518 09/28/21 1848 09/29/21 1728  PROCALCITON  --  <0.10  --   LATICACIDVEN 4.9* 3.2* 1.5    Recent Results (from the past 240 hour(s))  Resp Panel by RT-PCR (Flu A&B, Covid) Nasopharyngeal Swab     Status: None   Collection Time: 09/28/21  3:02 PM   Specimen: Nasopharyngeal Swab; Nasopharyngeal(NP) swabs in vial transport medium  Result Value Ref Range Status   SARS Coronavirus 2 by RT PCR  NEGATIVE NEGATIVE Final  Comment: (NOTE) SARS-CoV-2 target nucleic acids are NOT DETECTED.  The SARS-CoV-2 RNA is generally detectable in upper respiratory specimens during the acute phase of infection. The lowest concentration of SARS-CoV-2 viral copies this assay can detect is 138 copies/mL. A negative result does not preclude SARS-Cov-2 infection and should not be used as the sole basis for treatment or other patient management decisions. A negative result may occur with  improper specimen collection/handling, submission of specimen other than nasopharyngeal swab, presence of viral mutation(s) within the areas targeted by this assay, and inadequate number of viral copies(<138 copies/mL). A negative result must be combined with clinical observations, patient history, and epidemiological information. The expected result is Negative.  Fact Sheet for Patients:  EntrepreneurPulse.com.au  Fact Sheet for Healthcare Providers:  IncredibleEmployment.be  This test is no t yet approved or cleared by the [place] FDA and  has been authorized for detection and/or diagnosis of SARS-CoV-2 by FDA under an Emergency Use Authorization (EUA). This EUA will remain  in effect (meaning this test can be used) for the duration of the COVID-19 declaration under Section 564(b)(1) of the Act, 21 U.S.C.section 360bbb-3(b)(1), unless the authorization is terminated  or revoked sooner.       Influenza A by PCR NEGATIVE NEGATIVE Final   Influenza B by PCR NEGATIVE NEGATIVE Final    Comment: (NOTE) The Xpert Xpress SARS-CoV-2/FLU/RSV plus assay is intended as an aid in the diagnosis of influenza from Nasopharyngeal swab specimens and should not be used as a sole basis for treatment. Nasal washings and aspirates are unacceptable for Xpert Xpress SARS-CoV-2/FLU/RSV testing.  Fact Sheet for Patients: EntrepreneurPulse.com.au  Fact Sheet for  Healthcare Providers: IncredibleEmployment.be  This test is not yet approved or cleared by the [place] FDA and has been authorized for detection and/or diagnosis of SARS-CoV-2 by FDA under an Emergency Use Authorization (EUA). This EUA will remain in effect (meaning this test can be used) for the duration of the COVID-19 declaration under Section 564(b)(1) of the Act, 21 U.S.C. section 360bbb-3(b)(1), unless the authorization is terminated or revoked.  Performed at [facility], [address] [place], [place] [address]   Urine Culture     Status: Abnormal   Collection Time: 09/28/21  3:02 PM   Specimen: In/Out Cath Urine  Result Value Ref Range Status   Specimen Description   Final    IN/OUT CATH URINE Performed at [facility], [address] [place], [place] [address]    Special Requests   Final    NONE Performed at [facility], [address], [place], [place] [address]    Culture MULTIPLE SPECIES PRESENT, SUGGEST RECOLLECTION (A)  Final   Report Status 09/30/2021 FINAL  Final  Culture, blood (Routine X 2) w Reflex to ID Panel     Status: None   Collection Time: 09/28/21  3:18 PM   Specimen: BLOOD  Result Value Ref Range Status   Specimen Description BLOOD BLOOD LEFT FOREARM  Final   Special Requests   Final    BOTTLES DRAWN AEROBIC AND ANAEROBIC Blood Culture adequate volume   Culture   Final    NO GROWTH 5 DAYS Performed at [facility], [address], [place], [place] [address]    Report Status 10/03/2021 FINAL  Final  Culture, blood (Routine X 2) w Reflex to ID Panel     Status: None   Collection Time: 09/28/21  3:18 PM   Specimen: BLOOD  Result Value Ref Range Status   Specimen  Description BLOOD RIGHT ANTECUBITAL  Final   Special Requests   Final    BOTTLES DRAWN AEROBIC AND ANAEROBIC Blood Culture adequate volume   Culture   Final    NO GROWTH 5 DAYS Performed at [facility], [address], [place], [place] [address]    Report  Status 10/03/2021 FINAL  Final  MRSA Next Gen by PCR, Nasal     Status: None   Collection Time: 09/29/21 12:00 AM   Specimen: Nasal Mucosa; Nasal Swab  Result Value Ref Range Status   MRSA by PCR Next Gen NOT DETECTED NOT DETECTED Final    Comment: (NOTE) The GeneXpert MRSA Assay (FDA approved for NASAL specimens only), is one component of a comprehensive MRSA colonization surveillance program. It is not intended to diagnose MRSA infection nor to guide or monitor treatment for MRSA infections. Test performance is not FDA approved in patients less than 46 years old. Performed at [facility], [address] [place], [place] [address]   CSF culture w Gram Stain     Status: None   Collection Time: 09/30/21  9:36 AM   Specimen: CSF  Result Value Ref Range Status   Specimen Description   Final    CSF Performed at [facility], [address] [place], [place] [address]    Special Requests   Final    NONE Performed at [facility], [address] [place], [place] [address]    Gram Stain   Final    NO ORGANISMS SEEN WBC PRESENT,BOTH PMN AND MONONUCLEAR CYTOSPIN SMEAR   Culture   Final    NO GROWTH Performed at [facility], [place] [address] [place], [place] [address]    Report Status 10/03/2021 FINAL  Final         Radiology Studies: No results found.      Scheduled Meds:  acetaminophen  650 mg Oral Once   Chlorhexidine Gluconate Cloth  6 each Topical Daily   enoxaparin (LOVENOX) injection  80 mg Subcutaneous Q12H   insulin aspart  0-20 Units Subcutaneous TID WC   insulin aspart  0-5 Units Subcutaneous QHS   QUEtiapine  25 mg Oral QHS   Continuous Infusions:  potassium chloride 10 mEq (10/03/21 1316)     LOS: 5 days    Time spent: 35 minutes    Marque Rademaker Darleen Crocker, DO [facility]  If 7PM-7AM, please contact night-coverage [email or web address] 10/03/2021, 1:54 PM

## 2021-10-03 NOTE — Progress Notes (Signed)
Physical Therapy Treatment Patient Details Name: Calvin Mills MRN: [id number] DOB: 1940/07/01 Today's Date: 10/03/2021   History of Present Illness Calvin Mills is a 81 y.o. Caucasian male with medical history significant for type 2 diabetes mellitus, hypertension, dyslipidemia and urolithiasis, who presented to the emergency room with acute onset of altered mental status with unresponsiveness.  The patient's son who closely attentive his father has been monitoring him with a ring camera which stopped giving any drinks from 1 PM when they went to his home and found him in the bathroom tub covered in feces and was unresponsive and later lethargic.  He was noted to be hypoxic by EMS as well and was placed on O2 that was up to 4 L/min.  On room air his pulse oximetry was down to 88%.  No reported chest pain or palpitations or cough or wheezing.  No reported urinary symptoms.  The patient was very lethargic and barely arousable and therefore no history could be obtained from the patient.  Most of the history was obtained from the patient's son.    PT Comments    Patient demonstrates slow labored movement for sitting up at bedside with poor carryover for using LUE to hold onto bed rail due to weakness, fair/poor sitting balance with frequent leaning forward once seated at bedside and limited to a few slow labored unsteady side steps before having to sit due to generalized weakness, fatigue and fall risk.  Patient tolerated sitting up in chair after therapy with family member present in room.  Patient will benefit from continued skilled physical therapy in hospital and recommended venue below to increase strength, balance, endurance for safe ADLs and gait.      Recommendations for follow up therapy are one component of a multi-disciplinary discharge planning process, led by the attending physician.  Recommendations may be updated based on patient status, additional functional criteria and insurance  authorization.  Follow Up Recommendations  Skilled nursing-short term rehab (<3 hours/day)     Assistance Recommended at Discharge Frequent or constant Supervision/Assistance  Patient can return home with the following A lot of help with bathing/dressing/bathroom;A lot of help with walking and/or transfers;Help with stairs or ramp for entrance;Assistance with cooking/housework   Equipment Recommendations  None recommended by PT    Recommendations for Other Services       Precautions / Restrictions Precautions Precautions: Fall Restrictions Weight Bearing Restrictions: No     Mobility  Bed Mobility Overal bed mobility: Needs Assistance Bed Mobility: Supine to Sit     Supine to sit: +2 for physical assistance     General bed mobility comments: as per OT notes    Transfers Overall transfer level: Needs assistance Equipment used: Rolling walker (2 wheels) Transfers: Sit to/from Stand, Bed to chair/wheelchair/BSC Sit to Stand: +2 physical assistance Stand pivot transfers: Mod assist Step pivot transfers: Mod assist, Max assist       General transfer comment: as per OT notes    Ambulation/Gait Ambulation/Gait assistance: Mod assist, Max assist Gait Distance (Feet): 4 Feet Assistive device: Rolling walker (2 wheels) Gait Pattern/deviations: Decreased step length - left, Decreased step length - right, Decreased stride length, Trunk flexed, Knees buckling Gait velocity: decreased     General Gait Details: limited to a few unsteady labored side steps before having to sit due to weakness   Stairs             Wheelchair Mobility    Modified Rankin (Stroke Patients Only)  Balance Overall balance assessment: Needs assistance Sitting-balance support: Feet supported, No upper extremity supported Sitting balance-Leahy Scale: Poor Sitting balance - Comments: fair/poor seated at EOB   Standing balance support: During functional activity, Bilateral  upper extremity supported, Reliant on assistive device for balance Standing balance-Leahy Scale: Poor Standing balance comment: using RW                            Cognition Arousal/Alertness: Awake/alert Behavior During Therapy: WFL for tasks assessed/performed Overall Cognitive Status: History of cognitive impairments - at baseline                                          Exercises General Exercises - Lower Extremity Long Arc Quad: Seated, AAROM, Strengthening, Both, 5 reps    General Comments        Pertinent Vitals/Pain Pain Assessment Pain Assessment: No/denies pain    Home Living Family/patient expects to be discharged to:: Private residence Living Arrangements: Alone Available Help at Discharge: Family;Available PRN/intermittently Type of Home: House Home Access: Stairs to enter Entrance Stairs-Rails: Right;Left;Can reach both Entrance Stairs-Number of Steps: 3   Home Layout: One level Home Equipment: Conservation officer, nature (2 wheels);Cane - single point Additional Comments: family uses ring camera to check on patient per chart    Prior Function            PT Goals (current goals can now be found in the care plan section) Acute Rehab PT Goals Patient Stated Goal: return home after rehab PT Goal Formulation: With patient/family Time For Goal Achievement: 10/16/21 Potential to Achieve Goals: Good Progress towards PT goals: Progressing toward goals    Frequency    Min 3X/week      PT Plan Current plan remains appropriate    Co-evaluation PT/OT/SLP Co-Evaluation/Treatment: Yes Reason for Co-Treatment: To address functional/ADL transfers PT goals addressed during session: Mobility/safety with mobility;Balance;Proper use of DME;Strengthening/ROM OT goals addressed during session: ADL's and self-care      AM-PAC PT "6 Clicks" Mobility   Outcome Measure  Help needed turning from your back to your side while in a flat bed without  using bedrails?: A Lot Help needed moving from lying on your back to sitting on the side of a flat bed without using bedrails?: A Lot Help needed moving to and from a bed to a chair (including a wheelchair)?: A Lot Help needed standing up from a chair using your arms (e.g., wheelchair or bedside chair)?: A Lot Help needed to walk in hospital room?: A Lot Help needed climbing 3-5 steps with a railing? : Total 6 Click Score: 11    End of Session   Activity Tolerance: Patient tolerated treatment well;Patient limited by fatigue Patient left: in chair;with call bell/phone within reach;with family/visitor present;with chair alarm set Nurse Communication: Mobility status PT Visit Diagnosis: Unsteadiness on feet (R26.81);Other abnormalities of gait and mobility (R26.89);Muscle weakness (generalized) (M62.81)     Time: 9381-8299 PT Time Calculation (min) (ACUTE ONLY): 22 min  Charges:  $Therapeutic Activity: 8-22 mins                     11:22 AM, 10/03/21 Calvin Mills, MPT Physical Therapist with [facility] [phone] office [phone] mobile phone

## 2021-10-03 NOTE — Evaluation (Signed)
Occupational Therapy Evaluation Patient Details Name: Calvin Mills MRN: [id number] DOB: 1940/06/12 Today's Date: 10/03/2021   History of Present Illness Calvin Mills is a 81 y.o. Caucasian male with medical history significant for type 2 diabetes mellitus, hypertension, dyslipidemia and urolithiasis, who presented to the emergency room with acute onset of altered mental status with unresponsiveness.  The patient's son who closely attentive his father has been monitoring him with a ring camera which stopped giving any drinks from 1 PM when they went to his home and found him in the bathroom tub covered in feces and was unresponsive and later lethargic.  He was noted to be hypoxic by EMS as well and was placed on O2 that was up to 4 L/min.  On room air his pulse oximetry was down to 88%.  No reported chest pain or palpitations or cough or wheezing.  No reported urinary symptoms.  The patient was very lethargic and barely arousable and therefore no history could be obtained from the patient.  Most of the history was obtained from the patient's son. (Per PT eval.)   Clinical Impression   Pt. Agreeable to OT/PT co-treat. Pt. Requied mod- max assist for bed mobility going from supine to sitting EOB. Pt. Requiring frequent tactile and verbal cueing for sitting balance. Completed sit to stand t/f with +2 and RW. Assessed pt. UE ROM- R side less ROM than L side, noted generalized UE weakness. Left pt. In chair with son in room. Pt. Would beneifit from skilled OT during acute stay. Recommend skilled nursing after d/c.       Recommendations for follow up therapy are one component of a multi-disciplinary discharge planning process, led by the attending physician.  Recommendations may be updated based on patient status, additional functional criteria and insurance authorization.   Follow Up Recommendations  Skilled nursing-short term rehab (<3 hours/day)    Assistance Recommended at Discharge Frequent or  constant Supervision/Assistance  Patient can return home with the following A lot of help with walking and/or transfers;A lot of help with bathing/dressing/bathroom    Functional Status Assessment  Patient has had a recent decline in their functional status and/or demonstrates limited ability to make significant improvements in function in a reasonable and predictable amount of time  Equipment Recommendations       Recommendations for Other Services       Precautions / Restrictions Precautions Precautions: Fall Restrictions Weight Bearing Restrictions: No      Mobility Bed Mobility Overal bed mobility: Needs Assistance Bed Mobility: Supine to Sit     Supine to sit: +2 for physical assistance     General bed mobility comments: increased time, labored movement, requires frequent VC cueing and tactile cue.    Transfers Overall transfer level: Needs assistance Equipment used: Rolling walker (2 wheels) Transfers: Sit to/from Stand, Bed to chair/wheelchair/BSC Sit to Stand: +2 physical assistance Stand pivot transfers: Mod assist   Step pivot transfers: Mod assist, Max assist     General transfer comment: cueign to stand all the way up, slow labored movmement.      Balance Overall balance assessment: Needs assistance Sitting-balance support: Feet supported, No upper extremity supported   Sitting balance - Comments: seated at EOB   Standing balance support: During functional activity, Bilateral upper extremity supported, Reliant on assistive device for balance Standing balance-Leahy Scale: Poor Standing balance comment: using RW  ADL either performed or assessed with clinical judgement   ADL Overall ADL's : Needs assistance/impaired                                       General ADL Comments: Pt. altered mental status. Pt. requires 2+assisance for t/f and bed mobility- mod-max assistance needed for all ADLS.      Vision Baseline Vision/History:  (pt. not reliable historian) Vision Assessment?: Yes (when asking pt. to track pen- able to follow L and R, bu when moving open up and down pt. stopped visually tracking. Could be due to mental status) Eye Alignment: Within Functional Limits Tracking/Visual Pursuits: Decreased smoothness of vertical tracking                Pertinent Vitals/Pain Pain Assessment Pain Assessment: Faces Faces Pain Scale: Hurts a little bit     Hand Dominance     Extremity/Trunk Assessment Upper Extremity Assessment Upper Extremity Assessment: Generalized weakness       Cervical / Trunk Assessment Cervical / Trunk Assessment: Normal   Communication Communication Communication: No difficulties   Cognition Arousal/Alertness: Awake/alert (Pt. vovalizing but could not make out entirely what pt. was saying. Appeared to be confused) Behavior During Therapy: WFL for tasks assessed/performed Overall Cognitive Status: History of cognitive impairments - at baseline                                 General Comments: Confused- could follow directions for getting into chair and UE ROM assessment with tactile cueing and frequent prompting     General Comments                  Home Living Family/patient expects to be discharged to:: Private residence Living Arrangements: Alone Available Help at Discharge: Family;Available PRN/intermittently Type of Home: House Home Access: Stairs to enter [employer] of Steps: 3 Entrance Stairs-Rails: Right;Left;Can reach both Home Layout: One level     Bathroom Shower/Tub: Teacher, early years/pre: Handicapped height Bathroom Accessibility: Yes   Home Equipment: Conservation officer, nature (2 wheels);Cane - single point   Additional Comments: family uses ring camera to check on patient per chart      Prior Functioning/Environment Prior Level of Function : Independent/Modified Independent              Mobility Comments: household and short distanced community ambulator without AD, drives occasionally ADLs Comments: assisted for community ADLs by family        OT Problem List: Decreased strength;Decreased activity tolerance;Impaired balance (sitting and/or standing)      OT Treatment/Interventions: Self-care/ADL training;Therapeutic activities;Patient/family education    OT Goals(Current goals can be found in the care plan section) Acute Rehab OT Goals OT Goal Formulation: Patient unable to participate in goal setting Time For Goal Achievement: 10/17/21 Potential to Achieve Goals: Fair ADL Goals Pt Will Perform Grooming: with min assist;sitting Pt Will Perform Upper Body Bathing: with min assist;sitting Pt Will Transfer to Toilet: with min assist  OT Frequency: Min 1X/week    Co-evaluation PT/OT/SLP Co-Evaluation/Treatment: Yes Reason for Co-Treatment: To address functional/ADL transfers;For patient/therapist safety   OT goals addressed during session: ADL's and self-care      AM-PAC OT "6 Clicks" Daily Activity     Outcome Measure Help from another person eating meals?: A Lot Help from another person  taking care of personal grooming?: A Lot Help from another person toileting, which includes using toliet, bedpan, or urinal?: A Lot Help from another person bathing (including washing, rinsing, drying)?: A Lot Help from another person to put on and taking off regular upper body clothing?: A Lot Help from another person to put on and taking off regular lower body clothing?: A Lot 6 Click Score: 12   End of Session Nurse Communication: Mobility status  Activity Tolerance: Patient tolerated treatment well Patient left: in chair;with chair alarm set;with family/visitor present;with nursing/sitter in room  OT Visit Diagnosis: Muscle weakness (generalized) (M62.81)                Time: 3299-2426 OT Time Calculation (min): 25 min Charges:  OT Evaluation $OT Eval  Low Complexity: 1 Low  Arvil Persons, OTR/L   Frederic Jericho 10/03/2021, 9:53 AM

## 2021-10-03 NOTE — Plan of Care (Signed)
  Problem: Acute Rehab OT Goals (only OT should resolve) Goal: Pt. Will Perform Grooming Flowsheets (Taken 10/03/2021 0950) Pt Will Perform Grooming:  with min assist  sitting Goal: Pt. Will Perform Upper Body Bathing Flowsheets (Taken 10/03/2021 0950) Pt Will Perform Upper Body Bathing:  with min assist  sitting Goal: Pt. Will Transfer To Toilet Flowsheets (Taken 10/03/2021 0950) Pt Will Transfer to Toilet: with min assist  Arvil Persons, OTR/L

## 2021-10-03 NOTE — TOC Progression Note (Signed)
Transition of Care [facility]) - Progression Note    Patient Details  Name: Calvin Mills MRN: [id number] Date of Birth: 1941-04-24  [facility] CM/SW Contact  Boneta Lucks, RN Phone Number: 10/03/2021, 11:37 AM  Clinical Narrative:   Bed offers reviewed with Santiago Glad, Daughter in [place], son Elta [place]) asked TOC to speak with his wife.  She accepted the bed offer at [facility]. Insurance Auth started this morning and already received. TOC to follow for patient to be medically ready, possibly Monday.   Expected Discharge Plan: [place] Barriers to Discharge: Continued Medical Work up  Expected Discharge Plan and Services Expected Discharge Plan: [place] arrangements for the past 2 months: Single Family Home                   Readmission Risk Interventions    10/03/2021   11:22 AM 10/02/2021    3:18 PM  Readmission Risk Prevention Plan  Post Dischage Appt Complete Not Complete  Medication Screening Complete Complete  Transportation Screening Complete Complete

## 2021-10-03 NOTE — Progress Notes (Addendum)
Pt swatting hands at staff, refuses scheduled seroquel . States that he will break our legs . A & O x 1 , states that its the year 1964 when asked. Pt able to state and name and DOB. PRN IV haldol given , nurse tech at bedside. Provider Adefeso aware.Pt has tele sitter . Will continue to monitor, call bell within reach.

## 2021-10-03 NOTE — Progress Notes (Signed)
Pt now resting comfortably and more agreeable to care. Allowed nurse to place mitts back on pt.

## 2021-10-04 DIAGNOSIS — G9340 Encephalopathy, unspecified: Secondary | ICD-10-CM | POA: Diagnosis not present

## 2021-10-04 DIAGNOSIS — R6520 Severe sepsis without septic shock: Secondary | ICD-10-CM | POA: Diagnosis not present

## 2021-10-04 DIAGNOSIS — A419 Sepsis, unspecified organism: Secondary | ICD-10-CM | POA: Diagnosis not present

## 2021-10-04 LAB — BASIC METABOLIC PANEL
Anion gap: 11 (ref 5–15)
BUN: 9 mg/dL (ref 8–23)
CO2: 24 mmol/L (ref 22–32)
Calcium: 8.2 mg/dL — ABNORMAL LOW (ref 8.9–10.3)
Chloride: 107 mmol/L (ref 98–111)
Creatinine, Ser: 0.51 mg/dL — ABNORMAL LOW (ref 0.61–1.24)
GFR, Estimated: >60 mL/min (ref >60)
Glucose, Bld: 151 mg/dL — ABNORMAL HIGH (ref 70–99)
Potassium: 3 mmol/L — ABNORMAL LOW (ref 3.5–5.1)
Sodium: 142 mmol/L (ref 135–145)

## 2021-10-04 LAB — GLUCOSE, CAPILLARY
Glucose-Capillary: 170 mg/dL — ABNORMAL HIGH (ref 70–99)
Glucose-Capillary: 129 mg/dL — ABNORMAL HIGH (ref 70–99)
Glucose-Capillary: 90 mg/dL (ref 70–99)
Glucose-Capillary: 103 mg/dL — ABNORMAL HIGH (ref 70–99)

## 2021-10-04 LAB — AMMONIA: Ammonia: 14 umol/L (ref 9–35)

## 2021-10-04 LAB — CBC
HCT: 40.5 % (ref 39.0–52.0)
Hemoglobin: 13.3 g/dL (ref 13.0–17.0)
MCH: 31.9 pg (ref 26.0–34.0)
MCHC: 32.8 g/dL (ref 30.0–36.0)
MCV: 97.1 fL (ref 80.0–100.0)
Platelets: 157 10*3/uL (ref 150–400)
RBC: 4.17 MIL/uL — ABNORMAL LOW (ref 4.22–5.81)
RDW: 13.1 % (ref 11.5–15.5)
WBC: 9.9 10*3/uL (ref 4.0–10.5)
nRBC: 0 % (ref 0.0–0.2)

## 2021-10-04 LAB — MAGNESIUM: Magnesium: 1.6 mg/dL — ABNORMAL LOW (ref 1.7–2.4)

## 2021-10-04 MED ORDER — QUETIAPINE FUMARATE 25 MG PO TABS
25.0000 mg | ORAL_TABLET | Freq: Two times a day (BID) | ORAL | Status: DC
  Administered 2021-10-04 – 2021-10-07 (×7): 25 mg via ORAL
  Filled 2021-10-04 (×7): qty 1

## 2021-10-04 MED ORDER — MAGNESIUM SULFATE 4 GM/100ML IV SOLN
4.0000 g | Freq: Once | INTRAVENOUS | Status: AC
  Administered 2021-10-04: 4 g via INTRAVENOUS
  Filled 2021-10-04: qty 100

## 2021-10-04 MED ORDER — LISINOPRIL 10 MG PO TABS
20.0000 mg | ORAL_TABLET | Freq: Every day | ORAL | Status: DC
  Administered 2021-10-04 – 2021-10-07 (×4): 20 mg via ORAL
  Filled 2021-10-04 (×4): qty 2

## 2021-10-04 MED ORDER — POTASSIUM CHLORIDE 10 MEQ/100ML IV SOLN
10.0000 meq | INTRAVENOUS | Status: AC
  Administered 2021-10-04 (×6): 10 meq via INTRAVENOUS
  Filled 2021-10-04 (×4): qty 100

## 2021-10-04 MED ORDER — HALOPERIDOL LACTATE 5 MG/ML IJ SOLN
0.5000 mg | Freq: Four times a day (QID) | INTRAMUSCULAR | Status: AC
  Administered 2021-10-04 – 2021-10-05 (×4): 0.5 mg via INTRAVENOUS
  Filled 2021-10-04 (×4): qty 1

## 2021-10-04 NOTE — Plan of Care (Signed)

## 2021-10-04 NOTE — Progress Notes (Signed)
ANTICOAGULATION CONSULT NOTE -   Pharmacy Consult for heparin--> lovenox Indication: pulmonary embolus  No Known Allergies  Patient Measurements: Height: '5\' 10"'$  (177.8 cm) Weight: 77 kg (169 lb 12.1 oz) IBW/kg (Calculated) : 73  Vital Signs: Temp: 98.1 F (36.7 C) (05/20 0525) Temp Source: Oral (05/20 0525) BP: 175/95 (05/20 0525) Pulse Rate: 102 (05/20 0525)  Labs: Recent Labs    10/01/21 0900 10/01/21 1800 10/02/21 0435 10/02/21 0435 10/03/21 0508 10/04/21 0426  HGB  --   --  12.8*   < > 13.2 13.3  HCT  --   --  38.8*  --  40.2 40.5  PLT  --   --  110*  --  128* 157  HEPARINUNFRC 0.37 0.36 0.32  --   --   --   CREATININE  --   --  0.58*  --  0.56* 0.51*   < > = values in this interval not displayed.     Estimated Creatinine Clearance: 74.8 mL/min (A) (by C-G formula based on SCr of 0.51 mg/dL (L)).   Medical History: Past Medical History:  Diagnosis Date   Diabetes ([place])    High cholesterol    Hypertension    Kidney stone     Medications:  Medications Prior to Admission  Medication Sig Dispense Refill Last Dose   acetaminophen (TYLENOL) 325 MG tablet Take 2 tablets (650 mg total) by mouth every 4 (four) hours as needed for mild pain (or temp > 37.5 C (99.5 F)). 12 tablet 0 Past Week   aspirin EC 81 MG EC tablet Take 1 tablet (81 mg total) by mouth daily with breakfast. Please take Aspirin 81 mg daily along with Plavix 75 mg daily for 21 days then after that STOP the Plavix  and continue ONLY Aspirin 81 mg daily indefinitely--for  stroke Prevention (Patient taking differently: Take 81 mg by mouth daily with breakfast.) 30 tablet 11 09/27/2021   glipiZIDE (GLUCOTROL) 10 MG tablet Take 1 tablet (10 mg total) by mouth 2 (two) times daily before a meal. 180 tablet 4 09/27/2021   indomethacin (INDOCIN) 50 MG capsule Take 50 mg by mouth 2 (two) times daily.   09/27/2021   lisinopril-hydrochlorothiazide (PRINZIDE,ZESTORETIC) 20-25 MG per tablet Take 1 tablet by mouth  daily.   09/27/2021   metFORMIN (GLUCOPHAGE) 1000 MG tablet Take 1 tablet (1,000 mg total) by mouth 2 (two) times daily with a meal. 180 tablet 4 09/27/2021   clopidogrel (PLAVIX) 75 MG tablet Take 1 tablet (75 mg total) by mouth daily. Please take Aspirin 81 mg daily along with Plavix 75 mg daily for 21 days then after that STOP the Plavix  and continue ONLY Aspirin 81 mg daily indefinitely--for  stroke Prevention (Patient not taking: Reported on 09/28/2021) 30 tablet 0 Completed Course   Scheduled:   acetaminophen  650 mg Oral Once   Chlorhexidine Gluconate Cloth  6 each Topical Daily   enoxaparin (LOVENOX) injection  80 mg Subcutaneous Q12H   insulin aspart  0-20 Units Subcutaneous TID WC   insulin aspart  0-5 Units Subcutaneous QHS   lisinopril  20 mg Oral Daily   QUEtiapine  25 mg Oral QHS   Infusions:   magnesium sulfate bolus IVPB     potassium chloride      Assessment: 81yo male was found unresponsive by family >> started on ABX for sepsis, now w/ CT revealing mild PE in RUL >> to begin heparin. MD okay with transitioning to lovenox for tx  Goal of Therapy:  Heparin level 0.3-0.7 units/ml Monitor platelets by anticoagulation protocol: Yes   Plan:  Lovenox '80mg'$  ('1mg'$ /kg) sq q12h F/u transition to po tx when warranted Monitor CBC  Hart Robinsons, PharmD Clinical Pharmacist 10/04/2021 7:40 AM

## 2021-10-04 NOTE — Progress Notes (Signed)
PROGRESS NOTE    Calvin Mills  EPP:[id number] DOB: 1941-01-19 DOA: 09/28/2021 PCP: Redmond School, MD   Brief Narrative:    81 year old male who was in his usual state of health on 5/13, was found by his son with severe altered mental status/unresponsive on 5/14.  He was brought to the ER where he was noted to be febrile and a temperature of 103 F.  There was no clear source of infection.  Started on intravenous antibiotics.  Further infectious work-up is underway.  Also noted to have right-sided pulmonary emboli, started on anticoagulation.  He continues to have ongoing symptoms of delirium.  Assessment & Plan:   Principal Problem:   Sepsis ([place]) Active Problems:   Acute pulmonary embolus (HCC)   Acute respiratory failure with hypoxia (HCC)   Uncontrolled type 2 diabetes mellitus with hyperglycemia, with long-term current use of insulin (HCC)   Atrial fibrillation with rapid ventricular response (HCC)   Essential hypertension   Hypokalemia   Acute delirium  Assessment and Plan:   Sepsis, present on admission -Etiology is unclear, although concern for CNS infection -Lactic acid noted to be elevated on admission, improved with IV fluids -Noted to be febrile, tachycardic, tachypneic on admission -Blood cultures have not shown any growth as of yet -He has been started on intravenous antibiotics -Urinalysis is not convincing for infection -Chest x-ray/CT did not underlying pneumonia -Infectious disease following -Lumbar puncture today with initial results not indicating overwhelming infectious process -Culture results negative   Acute encephalopathy, with delirium -Related to sepsis, fever 103F on admission -MRI brain did not show any acute findings -EEG did not show any epileptiform discharges -ABG did not show any hypercarbia -Ammonia only minimally elevated -No recent changes in medication -He is not on any sedatives at home, he is not on SSRIs -Change in mental status  was relatively sudden, was last in usual state of health 5/13, and by 5/14 he was unresponsive -Lumbar puncture done 5/16 with initial studies not terribly convincing of underlying infection -Continue on Rocephin through 5/20 and acyclovir discontinued -Initial Gram stain results with no significant findings and culture pending   Acute pulmonary embolus -Have right-sided PE on CT imaging -Started on heparin infusion, pharmacy will transition to Lovenox -Echo without evidence of right-sided heart strain -Venous Dopplers of lower extremities negative for DVT -We will eventually transition to DOAC when condition stabilizes   Atrial fibrillation -Continue beta-blockers for rate control -Currently on anticoagulation with heparin infusion -Echocardiogram shows preserved ejection fraction   Diabetes -Continue on sliding scale insulin -Blood sugars elevated on admission, he was only taking oral agents prior to admission -Blood sugars currently borderline elevated, will follow -Follow-up A1c 10.4%   Hypertension-stable -Chronically on lisinopril/hydrochlorothiazide -Holding now since he is n.p.o. -Use labetalol IV as needed   Hypokalemia/hypomagnesemia -Replace   Acute delirium -With hallucinations -Monitor closely and started Seroquel 5/18, now increased to twice daily -Scheduled Haldol for 48 hours -High doses of Haldol every 6 hours as needed     DVT prophylaxis:   Full dose Lovenox   Code Status: DNR Family Communication: Updated son at the bedside 5/19 Disposition Plan: Status is: Inpatient Remains inpatient appropriate because: Remains with acute delirium and altered mentation.      Consultants:  Infectious disease   Procedures:   LP  Antimicrobials:  Anti-infectives (From admission, onward)    Start     Dose/Rate Route Frequency Ordered Stop   10/01/21 1004  cefTRIAXone (ROCEPHIN) 2 g in sodium  chloride 0.9 % 100 mL IVPB        2 g 200 mL/hr over 30 Minutes  Intravenous Every 24 hours 09/30/21 1341 10/03/21 1045   09/29/21 1700  acyclovir (ZOVIRAX) 710 mg in dextrose 5 % 250 mL IVPB  Status:  Discontinued        10 mg/kg  70.9 kg 264.2 mL/hr over 60 Minutes Intravenous Every 8 hours 09/29/21 1503 10/02/21 1245   09/29/21 1600  ampicillin (OMNIPEN) 2 g in sodium chloride 0.9 % 100 mL IVPB  Status:  Discontinued        2 g 300 mL/hr over 20 Minutes Intravenous Every 4 hours 09/29/21 1452 09/30/21 1341   09/29/21 1600  cefTRIAXone (ROCEPHIN) 2 g in sodium chloride 0.9 % 100 mL IVPB  Status:  Discontinued        2 g 200 mL/hr over 30 Minutes Intravenous Every 12 hours 09/29/21 1452 09/30/21 1341   09/29/21 1200  ceFEPIme (MAXIPIME) 2 g in sodium chloride 0.9 % 100 mL IVPB  Status:  Discontinued        2 g 200 mL/hr over 30 Minutes Intravenous Every 8 hours 09/29/21 1051 09/29/21 1452   09/29/21 0600  vancomycin (VANCOREADY) IVPB 750 mg/150 mL  Status:  Discontinued        750 mg 150 mL/hr over 60 Minutes Intravenous Every 12 hours 09/28/21 1555 09/30/21 1341   09/29/21 0500  ceFEPIme (MAXIPIME) 2 g in sodium chloride 0.9 % 100 mL IVPB  Status:  Discontinued        2 g 200 mL/hr over 30 Minutes Intravenous Every 12 hours 09/28/21 1555 09/29/21 1051   09/29/21 0300  metroNIDAZOLE (FLAGYL) IVPB 500 mg  Status:  Discontinued        500 mg 100 mL/hr over 60 Minutes Intravenous Every 12 hours 09/28/21 2156 09/29/21 1452   09/28/21 2200  ceFEPIme (MAXIPIME) 2 g in sodium chloride 0.9 % 100 mL IVPB  Status:  Discontinued        2 g 200 mL/hr over 30 Minutes Intravenous  Once 09/28/21 2156 09/28/21 2159   09/28/21 2200  vancomycin (VANCOCIN) IVPB 1000 mg/200 mL premix  Status:  Discontinued        1,000 mg 200 mL/hr over 60 Minutes Intravenous  Once 09/28/21 2156 09/28/21 2159   09/28/21 1600  vancomycin (VANCOREADY) IVPB 1500 mg/300 mL        1,500 mg 150 mL/hr over 120 Minutes Intravenous  Once 09/28/21 1550 09/28/21 1835   09/28/21 1530  ceFEPIme  (MAXIPIME) 2 g in sodium chloride 0.9 % 100 mL IVPB        2 g 200 mL/hr over 30 Minutes Intravenous  Once 09/28/21 1523 09/28/21 1631   09/28/21 1530  vancomycin (VANCOCIN) IVPB 1000 mg/200 mL premix  Status:  Discontinued        1,000 mg 200 mL/hr over 60 Minutes Intravenous  Once 09/28/21 1523 09/28/21 1550   09/28/21 1515  metroNIDAZOLE (FLAGYL) IVPB 500 mg        500 mg 100 mL/hr over 60 Minutes Intravenous  Once 09/28/21 1503 09/28/21 1630      Subjective: Patient seen and evaluated today with ongoing issues of delirium noted.  Son is at bedside.  Objective: Vitals:   10/03/21 0532 10/03/21 1330 10/03/21 2113 10/04/21 0525  BP: (!) 138/91 (!) 167/119 (!) 176/90 (!) 175/95  Pulse: (!) 110 100 (!) 108 (!) 102  Resp: '20  20 20  '$ Temp: 98.6  F (37 C)  98.4 F (36.9 C) 98.1 F (36.7 C)  TempSrc:   Oral Oral  SpO2: 93% 95% 92% 95%  Weight:      Height:        Intake/Output Summary (Last 24 hours) at 10/04/2021 1043 Last data filed at 10/04/2021 2122 Gross per 24 hour  Intake 501.62 ml  Output 2351 ml  Net -1849.38 ml   Filed Weights   09/30/21 0408 10/01/21 0500 10/02/21 0500  Weight: 74 kg 74.1 kg 77 kg    Examination:  General exam: Appears confused Respiratory system: Clear to auscultation. Respiratory effort normal. Cardiovascular system: S1 & S2 heard, RRR.  Gastrointestinal system: Abdomen is soft Central nervous system: Confused Extremities: No edema Skin: No significant lesions noted Psychiatry: Flat affect.    Data Reviewed: I have personally reviewed following labs and imaging studies  CBC: Recent Labs  Lab 09/28/21 1518 09/29/21 0400 09/30/21 0557 10/01/21 0422 10/02/21 0435 10/03/21 0508 10/04/21 0426  WBC 14.2*   < > 13.0* 8.0 6.4 8.5 9.9  NEUTROABS 12.4*  --   --   --   --   --   --   HGB 16.8   < > 14.6 13.8 12.8* 13.2 13.3  HCT 47.8   < > 43.1 40.3 38.8* 40.2 40.5  MCV 93.0   < > 96.9 96.4 95.3 95.5 97.1  PLT 210   < > 143* 109*  110* 128* 157   < > = values in this interval not displayed.   Basic Metabolic Panel: Recent Labs  Lab 09/30/21 0557 10/01/21 0422 10/02/21 0435 10/03/21 0508 10/04/21 0426  NA 146* 142 141 143 142  K 2.8* 3.1* 3.2* 2.9* 3.0*  CL 115* 114* 111 111 107  CO2 '26 24 23 23 24  '$ GLUCOSE 179* 206* 236* 163* 151*  BUN 30* '18 12 8 9  '$ CREATININE 0.72 0.47* 0.58* 0.56* 0.51*  CALCIUM 8.4* 7.8* 8.0* 8.2* 8.2*  MG 1.7 1.6* 1.7 1.5* 1.6*   GFR: Estimated Creatinine Clearance: 74.8 mL/min (A) (by C-G formula based on SCr of 0.51 mg/dL (L)). Liver Function Tests: Recent Labs  Lab 09/28/21 1518 09/30/21 0557 10/01/21 0422  AST 44* 32 31  ALT '29 25 24  '$ ALKPHOS 84 53 54  BILITOT 1.6* 1.4* 0.9  PROT 8.1 6.1* 5.6*  ALBUMIN 4.5 3.2* 2.8*   No results for input(s): LIPASE, AMYLASE in the last 168 hours. Recent Labs  Lab 09/29/21 1728 10/04/21 0731  AMMONIA 47* 14   Coagulation Profile: Recent Labs  Lab 09/28/21 1518 09/29/21 0400  INR 1.1 1.2   Cardiac Enzymes: No results for input(s): CKTOTAL, CKMB, CKMBINDEX, TROPONINI in the last 168 hours. BNP (last 3 results) No results for input(s): PROBNP in the last 8760 hours. HbA1C: No results for input(s): HGBA1C in the last 72 hours. CBG: Recent Labs  Lab 10/03/21 0759 10/03/21 1124 10/03/21 1620 10/03/21 2115 10/04/21 0754  GLUCAP 170* 149* 83 164* 170*   Lipid Profile: No results for input(s): CHOL, HDL, LDLCALC, TRIG, CHOLHDL, LDLDIRECT in the last 72 hours. Thyroid Function Tests: No results for input(s): TSH, T4TOTAL, FREET4, T3FREE, THYROIDAB in the last 72 hours. Anemia Panel: No results for input(s): VITAMINB12, FOLATE, FERRITIN, TIBC, IRON, RETICCTPCT in the last 72 hours. Sepsis Labs: Recent Labs  Lab 09/28/21 1518 09/28/21 1848 09/29/21 1728  PROCALCITON  --  <0.10  --   LATICACIDVEN 4.9* 3.2* 1.5    Recent Results (from the past 240 hour(s))  Resp Panel by RT-PCR (Flu A&B, Covid) Nasopharyngeal Swab      Status: None   Collection Time: 09/28/21  3:02 PM   Specimen: Nasopharyngeal Swab; Nasopharyngeal(NP) swabs in vial transport medium  Result Value Ref Range Status   SARS Coronavirus 2 by RT PCR NEGATIVE NEGATIVE Final    Comment: (NOTE) SARS-CoV-2 target nucleic acids are NOT DETECTED.  The SARS-CoV-2 RNA is generally detectable in upper respiratory specimens during the acute phase of infection. The lowest concentration of SARS-CoV-2 viral copies this assay can detect is 138 copies/mL. A negative result does not preclude SARS-Cov-2 infection and should not be used as the sole basis for treatment or other patient management decisions. A negative result may occur with  improper specimen collection/handling, submission of specimen other than nasopharyngeal swab, presence of viral mutation(s) within the areas targeted by this assay, and inadequate number of viral copies(<138 copies/mL). A negative result must be combined with clinical observations, patient history, and epidemiological information. The expected result is Negative.  Fact Sheet for Patients:  EntrepreneurPulse.com.au  Fact Sheet for Healthcare Providers:  IncredibleEmployment.be  This test is no t yet approved or cleared by the [place] FDA and  has been authorized for detection and/or diagnosis of SARS-CoV-2 by FDA under an Emergency Use Authorization (EUA). This EUA will remain  in effect (meaning this test can be used) for the duration of the COVID-19 declaration under Section 564(b)(1) of the Act, 21 U.S.C.section 360bbb-3(b)(1), unless the authorization is terminated  or revoked sooner.       Influenza A by PCR NEGATIVE NEGATIVE Final   Influenza B by PCR NEGATIVE NEGATIVE Final    Comment: (NOTE) The Xpert Xpress SARS-CoV-2/FLU/RSV plus assay is intended as an aid in the diagnosis of influenza from Nasopharyngeal swab specimens and should not be used as a sole basis  for treatment. Nasal washings and aspirates are unacceptable for Xpert Xpress SARS-CoV-2/FLU/RSV testing.  Fact Sheet for Patients: EntrepreneurPulse.com.au  Fact Sheet for Healthcare Providers: IncredibleEmployment.be  This test is not yet approved or cleared by the [place] FDA and has been authorized for detection and/or diagnosis of SARS-CoV-2 by FDA under an Emergency Use Authorization (EUA). This EUA will remain in effect (meaning this test can be used) for the duration of the COVID-19 declaration under Section 564(b)(1) of the Act, 21 U.S.C. section 360bbb-3(b)(1), unless the authorization is terminated or revoked.  Performed at [facility], [address], [place], [place] [address]   Urine Culture     Status: Abnormal   Collection Time: 09/28/21  3:02 PM   Specimen: In/Out Cath Urine  Result Value Ref Range Status   Specimen Description   Final    IN/OUT CATH URINE Performed at [facility], [address] [place], [place] [address]    Special Requests   Final    NONE Performed at [facility], [address] [place],  [address]    Culture MULTIPLE SPECIES PRESENT, SUGGEST RECOLLECTION (A)  Final   Report Status 09/30/2021 FINAL  Final  Culture, blood (Routine X 2) w Reflex to ID Panel     Status: None   Collection Time: 09/28/21  3:18 PM   Specimen: BLOOD  Result Value Ref Range Status   Specimen Description BLOOD BLOOD LEFT FOREARM  Final   Special Requests   Final    BOTTLES DRAWN AEROBIC AND ANAEROBIC Blood Culture adequate volume   Culture   Final    NO GROWTH 5 DAYS Performed at [facility]  [facility], [address], [place], [place] [address]    Report Status 10/03/2021 FINAL  Final  Culture, blood (Routine X 2) w Reflex to ID Panel     Status: None   Collection Time: 09/28/21  3:18 PM   Specimen: BLOOD  Result Value Ref Range Status   Specimen Description BLOOD RIGHT ANTECUBITAL  Final   Special Requests   Final     BOTTLES DRAWN AEROBIC AND ANAEROBIC Blood Culture adequate volume   Culture   Final    NO GROWTH 5 DAYS Performed at [facility], [address] [place], [place] [address]    Report Status 10/03/2021 FINAL  Final  MRSA Next Gen by PCR, Nasal     Status: None   Collection Time: 09/29/21 12:00 AM   Specimen: Nasal Mucosa; Nasal Swab  Result Value Ref Range Status   MRSA by PCR Next Gen NOT DETECTED NOT DETECTED Final    Comment: (NOTE) The GeneXpert MRSA Assay (FDA approved for NASAL specimens only), is one component of a comprehensive MRSA colonization surveillance program. It is not intended to diagnose MRSA infection nor to guide or monitor treatment for MRSA infections. Test performance is not FDA approved in patients less than 51 years old. Performed at [facility], [address] [place], [place] [address]   CSF culture w Gram Stain     Status: None   Collection Time: 09/30/21  9:36 AM   Specimen: CSF  Result Value Ref Range Status   Specimen Description   Final    CSF Performed at [facility], [address] [place], [place] [address]    Special Requests   Final    NONE Performed at [facility], [address] [place], [place] [address]    Gram Stain   Final    NO ORGANISMS SEEN WBC PRESENT,BOTH PMN AND MONONUCLEAR CYTOSPIN SMEAR   Culture   Final    NO GROWTH Performed at [facility], [place] [address], [place], [place] [address]    Report Status 10/03/2021 FINAL  Final         Radiology Studies: No results found.      Scheduled Meds:  acetaminophen  650 mg Oral Once   Chlorhexidine Gluconate Cloth  6 each Topical Daily   enoxaparin (LOVENOX) injection  80 mg Subcutaneous Q12H   haloperidol lactate  0.5 mg Intravenous Q6H   insulin aspart  0-20 Units Subcutaneous TID WC   insulin aspart  0-5 Units Subcutaneous QHS   lisinopril  20 mg Oral Daily   QUEtiapine  25 mg Oral BID   Continuous Infusions:  magnesium sulfate bolus IVPB      potassium chloride 10 mEq (10/04/21 1033)     LOS: 6 days    Time spent: 35 minutes    Ayaana Biondo Darleen Crocker, DO [facility]  If 7PM-7AM, please contact night-coverage [email or web address] 10/04/2021, 10:43 AM

## 2021-10-05 DIAGNOSIS — A419 Sepsis, unspecified organism: Secondary | ICD-10-CM | POA: Diagnosis not present

## 2021-10-05 DIAGNOSIS — R6520 Severe sepsis without septic shock: Secondary | ICD-10-CM | POA: Diagnosis not present

## 2021-10-05 DIAGNOSIS — G9340 Encephalopathy, unspecified: Secondary | ICD-10-CM | POA: Diagnosis not present

## 2021-10-05 LAB — BASIC METABOLIC PANEL
Anion gap: 8 (ref 5–15)
BUN: 14 mg/dL (ref 8–23)
CO2: 25 mmol/L (ref 22–32)
Calcium: 8.2 mg/dL — ABNORMAL LOW (ref 8.9–10.3)
Chloride: 109 mmol/L (ref 98–111)
Creatinine, Ser: 0.7 mg/dL (ref 0.61–1.24)
GFR, Estimated: >60 mL/min (ref >60)
Glucose, Bld: 129 mg/dL — ABNORMAL HIGH (ref 70–99)
Potassium: 3.4 mmol/L — ABNORMAL LOW (ref 3.5–5.1)
Sodium: 142 mmol/L (ref 135–145)

## 2021-10-05 LAB — CBC
HCT: 39.4 % (ref 39.0–52.0)
Hemoglobin: 13.3 g/dL (ref 13.0–17.0)
MCH: 33.3 pg (ref 26.0–34.0)
MCHC: 33.8 g/dL (ref 30.0–36.0)
MCV: 98.5 fL (ref 80.0–100.0)
Platelets: 162 10*3/uL (ref 150–400)
RBC: 4 MIL/uL — ABNORMAL LOW (ref 4.22–5.81)
RDW: 13.3 % (ref 11.5–15.5)
WBC: 8.5 10*3/uL (ref 4.0–10.5)
nRBC: 0 % (ref 0.0–0.2)

## 2021-10-05 LAB — GLUCOSE, CAPILLARY
Glucose-Capillary: 138 mg/dL — ABNORMAL HIGH (ref 70–99)
Glucose-Capillary: 178 mg/dL — ABNORMAL HIGH (ref 70–99)
Glucose-Capillary: 109 mg/dL — ABNORMAL HIGH (ref 70–99)
Glucose-Capillary: 178 mg/dL — ABNORMAL HIGH (ref 70–99)

## 2021-10-05 LAB — MAGNESIUM: Magnesium: 2.1 mg/dL (ref 1.7–2.4)

## 2021-10-05 MED ORDER — POTASSIUM CHLORIDE CRYS ER 20 MEQ PO TBCR
40.0000 meq | EXTENDED_RELEASE_TABLET | Freq: Once | ORAL | Status: AC
  Administered 2021-10-05: 40 meq via ORAL
  Filled 2021-10-05: qty 2

## 2021-10-05 NOTE — Progress Notes (Signed)
PROGRESS NOTE    Calvin Mills  GEX:[id number] DOB: 06/17/1940 DOA: 09/28/2021 PCP: Redmond School, MD   Brief Narrative:    81 year old male who was in his usual state of health on 5/13, was found by his son with severe altered mental status/unresponsive on 5/14.  He was brought to the ER where he was noted to be febrile and a temperature of 103 F.  There was no clear source of infection.  Started on intravenous antibiotics.  Further infectious work-up is underway.  Also noted to have right-sided pulmonary emboli, started on anticoagulation.  He continues to have ongoing symptoms of delirium.  Palliative consulted as he appears to now be a candidate for hospice.  Assessment & Plan:   Principal Problem:   Sepsis ([place]) Active Problems:   Acute pulmonary embolus (HCC)   Acute respiratory failure with hypoxia (HCC)   Uncontrolled type 2 diabetes mellitus with hyperglycemia, with long-term current use of insulin (HCC)   Atrial fibrillation with rapid ventricular response (HCC)   Essential hypertension   Hypokalemia   Acute delirium  Assessment and Plan:   Sepsis, present on admission -Etiology is unclear, although concern for CNS infection -Lactic acid noted to be elevated on admission, improved with IV fluids -Noted to be febrile, tachycardic, tachypneic on admission -Blood cultures have not shown any growth as of yet -He has been started on intravenous antibiotics -Urinalysis is not convincing for infection -Chest x-ray/CT did not underlying pneumonia -Infectious disease following -Lumbar puncture today with initial results not indicating overwhelming infectious process -Culture results negative   Acute encephalopathy, with delirium -Related to sepsis, fever 103F on admission -MRI brain did not show any acute findings -EEG did not show any epileptiform discharges -ABG did not show any hypercarbia -Ammonia only minimally elevated -No recent changes in medication -He is not  on any sedatives at home, he is not on SSRIs -Change in mental status was relatively sudden, was last in usual state of health 5/13, and by 5/14 he was unresponsive -Lumbar puncture done 5/16 with initial studies not terribly convincing of underlying infection -Continue on Rocephin through 5/20 and acyclovir discontinued -Initial Gram stain results with no significant findings and culture pending -Now with concern for worsening dementia.  Palliative consulted for consideration of transition to hospice/comfort care by 5/22.   Acute pulmonary embolus -Have right-sided PE on CT imaging -Started on heparin infusion, pharmacy will transition to Lovenox -Echo without evidence of right-sided heart strain -Venous Dopplers of lower extremities negative for DVT -We will eventually transition to [place] when condition stabilizes   Atrial fibrillation -Continue beta-blockers for rate control -Currently on anticoagulation with heparin infusion -Echocardiogram shows preserved ejection fraction   Diabetes -Continue on sliding scale insulin -Blood sugars elevated on admission, he was only taking oral agents prior to admission -Blood sugars currently borderline elevated, will follow -Follow-up A1c 10.4%   Hypertension-stable -Chronically on lisinopril/hydrochlorothiazide -Holding now since he is n.p.o. -Use labetalol IV as needed   Hypokalemia/hypomagnesemia -Replace   Acute delirium -With hallucinations -Monitor closely and started Seroquel 5/18, now increased to twice daily -Scheduled Haldol for 48 hours -High doses of Haldol every 6 hours as needed     DVT prophylaxis:   Full dose Lovenox   Code Status: DNR Family Communication: Updated daughter at the bedside 5/21 Disposition Plan: Status is: Inpatient Remains inpatient appropriate because: Remains with acute delirium and altered mentation.      Consultants:  Infectious disease Palliative care   Procedures:  LP    Antimicrobials:  Anti-infectives (From admission, onward)    Start     Dose/Rate Route Frequency Ordered Stop   10/01/21 1004  cefTRIAXone (ROCEPHIN) 2 g in sodium chloride 0.9 % 100 mL IVPB        2 g 200 mL/hr over 30 Minutes Intravenous Every 24 hours 09/30/21 1341 10/03/21 1045   09/29/21 1700  acyclovir (ZOVIRAX) 710 mg in dextrose 5 % 250 mL IVPB  Status:  Discontinued        10 mg/kg  70.9 kg 264.2 mL/hr over 60 Minutes Intravenous Every 8 hours 09/29/21 1503 10/02/21 1245   09/29/21 1600  ampicillin (OMNIPEN) 2 g in sodium chloride 0.9 % 100 mL IVPB  Status:  Discontinued        2 g 300 mL/hr over 20 Minutes Intravenous Every 4 hours 09/29/21 1452 09/30/21 1341   09/29/21 1600  cefTRIAXone (ROCEPHIN) 2 g in sodium chloride 0.9 % 100 mL IVPB  Status:  Discontinued        2 g 200 mL/hr over 30 Minutes Intravenous Every 12 hours 09/29/21 1452 09/30/21 1341   09/29/21 1200  ceFEPIme (MAXIPIME) 2 g in sodium chloride 0.9 % 100 mL IVPB  Status:  Discontinued        2 g 200 mL/hr over 30 Minutes Intravenous Every 8 hours 09/29/21 1051 09/29/21 1452   09/29/21 0600  vancomycin (VANCOREADY) IVPB 750 mg/150 mL  Status:  Discontinued        750 mg 150 mL/hr over 60 Minutes Intravenous Every 12 hours 09/28/21 1555 09/30/21 1341   09/29/21 0500  ceFEPIme (MAXIPIME) 2 g in sodium chloride 0.9 % 100 mL IVPB  Status:  Discontinued        2 g 200 mL/hr over 30 Minutes Intravenous Every 12 hours 09/28/21 1555 09/29/21 1051   09/29/21 0300  metroNIDAZOLE (FLAGYL) IVPB 500 mg  Status:  Discontinued        500 mg 100 mL/hr over 60 Minutes Intravenous Every 12 hours 09/28/21 2156 09/29/21 1452   09/28/21 2200  ceFEPIme (MAXIPIME) 2 g in sodium chloride 0.9 % 100 mL IVPB  Status:  Discontinued        2 g 200 mL/hr over 30 Minutes Intravenous  Once 09/28/21 2156 09/28/21 2159   09/28/21 2200  vancomycin (VANCOCIN) IVPB 1000 mg/200 mL premix  Status:  Discontinued        1,000 mg 200 mL/hr over 60  Minutes Intravenous  Once 09/28/21 2156 09/28/21 2159   09/28/21 1600  vancomycin (VANCOREADY) IVPB 1500 mg/300 mL        1,500 mg 150 mL/hr over 120 Minutes Intravenous  Once 09/28/21 1550 09/28/21 1835   09/28/21 1530  ceFEPIme (MAXIPIME) 2 g in sodium chloride 0.9 % 100 mL IVPB        2 g 200 mL/hr over 30 Minutes Intravenous  Once 09/28/21 1523 09/28/21 1631   09/28/21 1530  vancomycin (VANCOCIN) IVPB 1000 mg/200 mL premix  Status:  Discontinued        1,000 mg 200 mL/hr over 60 Minutes Intravenous  Once 09/28/21 1523 09/28/21 1550   09/28/21 1515  metroNIDAZOLE (FLAGYL) IVPB 500 mg        500 mg 100 mL/hr over 60 Minutes Intravenous  Once 09/28/21 1503 09/28/21 1630       Subjective: Patient seen and evaluated today with restless evening noted.  He still continues to have ongoing confusion.  Objective: Vitals:   10/04/21  0525 10/04/21 1447 10/04/21 2115 10/05/21 0536  BP: (!) 175/95 (!) 167/86 (!) 151/85 (!) 153/83  Pulse: (!) 102 100 (!) 101 95  Resp: '20 20 18 20  '$ Temp: 98.1 F (36.7 C) 98.2 F (36.8 C) 98.1 F (36.7 C) (!) 97.3 F (36.3 C)  TempSrc: Oral Oral Oral Oral  SpO2: 95%  94% 97%  Weight:      Height:        Intake/Output Summary (Last 24 hours) at 10/05/2021 0906 Last data filed at 10/04/2021 1833 Gross per 24 hour  Intake 945.66 ml  Output 700 ml  Net 245.66 ml   Filed Weights   09/30/21 0408 10/01/21 0500 10/02/21 0500  Weight: 74 kg 74.1 kg 77 kg    Examination:  General exam: Appears calm and comfortable  Respiratory system: Clear to auscultation. Respiratory effort normal. Cardiovascular system: S1 & S2 heard, RRR.  Gastrointestinal system: Abdomen is soft Central nervous system: Alert and awake Extremities: No edema Skin: No significant lesions noted Psychiatry: Flat affect.    Data Reviewed: I have personally reviewed following labs and imaging studies  CBC: Recent Labs  Lab 09/28/21 1518 09/29/21 0400 10/01/21 0422  10/02/21 0435 10/03/21 0508 10/04/21 0426 10/05/21 0442  WBC 14.2*   < > 8.0 6.4 8.5 9.9 8.5  NEUTROABS 12.4*  --   --   --   --   --   --   HGB 16.8   < > 13.8 12.8* 13.2 13.3 13.3  HCT 47.8   < > 40.3 38.8* 40.2 40.5 39.4  MCV 93.0   < > 96.4 95.3 95.5 97.1 98.5  PLT 210   < > 109* 110* 128* 157 162   < > = values in this interval not displayed.   Basic Metabolic Panel: Recent Labs  Lab 10/01/21 0422 10/02/21 0435 10/03/21 0508 10/04/21 0426 10/05/21 0442  NA 142 141 143 142 142  K 3.1* 3.2* 2.9* 3.0* 3.4*  CL 114* 111 111 107 109  CO2 '24 23 23 24 25  '$ GLUCOSE 206* 236* 163* 151* 129*  BUN '18 12 8 9 14  '$ CREATININE 0.47* 0.58* 0.56* 0.51* 0.70  CALCIUM 7.8* 8.0* 8.2* 8.2* 8.2*  MG 1.6* 1.7 1.5* 1.6* 2.1   GFR: Estimated Creatinine Clearance: 74.8 mL/min (by C-G formula based on SCr of 0.7 mg/dL). Liver Function Tests: Recent Labs  Lab 09/28/21 1518 09/30/21 0557 10/01/21 0422  AST 44* 32 31  ALT '29 25 24  '$ ALKPHOS 84 53 54  BILITOT 1.6* 1.4* 0.9  PROT 8.1 6.1* 5.6*  ALBUMIN 4.5 3.2* 2.8*   No results for input(s): LIPASE, AMYLASE in the last 168 hours. Recent Labs  Lab 09/29/21 1728 10/04/21 0731  AMMONIA 47* 14   Coagulation Profile: Recent Labs  Lab 09/28/21 1518 09/29/21 0400  INR 1.1 1.2   Cardiac Enzymes: No results for input(s): CKTOTAL, CKMB, CKMBINDEX, TROPONINI in the last 168 hours. BNP (last 3 results) No results for input(s): PROBNP in the last 8760 hours. HbA1C: No results for input(s): HGBA1C in the last 72 hours. CBG: Recent Labs  Lab 10/04/21 0754 10/04/21 1118 10/04/21 1618 10/04/21 2118 10/05/21 0721  GLUCAP 170* 129* 90 103* 138*   Lipid Profile: No results for input(s): CHOL, HDL, LDLCALC, TRIG, CHOLHDL, LDLDIRECT in the last 72 hours. Thyroid Function Tests: No results for input(s): TSH, T4TOTAL, FREET4, T3FREE, THYROIDAB in the last 72 hours. Anemia Panel: No results for input(s): VITAMINB12, FOLATE, FERRITIN, TIBC,  IRON, RETICCTPCT  in the last 72 hours. Sepsis Labs: Recent Labs  Lab 09/28/21 1518 09/28/21 1848 09/29/21 1728  PROCALCITON  --  <0.10  --   LATICACIDVEN 4.9* 3.2* 1.5    Recent Results (from the past 240 hour(s))  Resp Panel by RT-PCR (Flu A&B, Covid) Nasopharyngeal Swab     Status: None   Collection Time: 09/28/21  3:02 PM   Specimen: Nasopharyngeal Swab; Nasopharyngeal(NP) swabs in vial transport medium  Result Value Ref Range Status   SARS Coronavirus 2 by RT PCR NEGATIVE NEGATIVE Final    Comment: (NOTE) SARS-CoV-2 target nucleic acids are NOT DETECTED.  The SARS-CoV-2 RNA is generally detectable in upper respiratory specimens during the acute phase of infection. The lowest concentration of SARS-CoV-2 viral copies this assay can detect is 138 copies/mL. A negative result does not preclude SARS-Cov-2 infection and should not be used as the sole basis for treatment or other patient management decisions. A negative result may occur with  improper specimen collection/handling, submission of specimen other than nasopharyngeal swab, presence of viral mutation(s) within the areas targeted by this assay, and inadequate number of viral copies(<138 copies/mL). A negative result must be combined with clinical observations, patient history, and epidemiological information. The expected result is Negative.  Fact Sheet for Patients:  EntrepreneurPulse.com.au  Fact Sheet for Healthcare Providers:  IncredibleEmployment.be  This test is no t yet approved or cleared by the [place] FDA and  has been authorized for detection and/or diagnosis of SARS-CoV-2 by FDA under an Emergency Use Authorization (EUA). This EUA will remain  in effect (meaning this test can be used) for the duration of the COVID-19 declaration under Section 564(b)(1) of the Act, 21 U.S.C.section 360bbb-3(b)(1), unless the authorization is terminated  or revoked sooner.        Influenza A by PCR NEGATIVE NEGATIVE Final   Influenza B by PCR NEGATIVE NEGATIVE Final    Comment: (NOTE) The Xpert Xpress SARS-CoV-2/FLU/RSV plus assay is intended as an aid in the diagnosis of influenza from Nasopharyngeal swab specimens and should not be used as a sole basis for treatment. Nasal washings and aspirates are unacceptable for Xpert Xpress SARS-CoV-2/FLU/RSV testing.  Fact Sheet for Patients: EntrepreneurPulse.com.au  Fact Sheet for Healthcare Providers: IncredibleEmployment.be  This test is not yet approved or cleared by the [place] FDA and has been authorized for detection and/or diagnosis of SARS-CoV-2 by FDA under an Emergency Use Authorization (EUA). This EUA will remain in effect (meaning this test can be used) for the duration of the COVID-19 declaration under Section 564(b)(1) of the Act, 21 U.S.C. section 360bbb-3(b)(1), unless the authorization is terminated or revoked.  Performed at [facility], [address] [place], [place] [address]   Urine Culture     Status: Abnormal   Collection Time: 09/28/21  3:02 PM   Specimen: In/Out Cath Urine  Result Value Ref Range Status   Specimen Description   Final    IN/OUT CATH URINE Performed at [facility], [address] [place], [place] [address]    Special Requests   Final    NONE Performed at [facility], [address], [place], [place] [address]    Culture MULTIPLE SPECIES PRESENT, SUGGEST RECOLLECTION (A)  Final   Report Status 09/30/2021 FINAL  Final  Culture, blood (Routine X 2) w Reflex to ID Panel     Status: None   Collection Time: 09/28/21  3:18 PM   Specimen: BLOOD  Result Value Ref Range Status   Specimen Description  BLOOD BLOOD LEFT FOREARM  Final   Special Requests   Final    BOTTLES DRAWN AEROBIC AND ANAEROBIC Blood Culture adequate volume   Culture   Final    NO GROWTH 5 DAYS Performed at [facility], [address], [place], [place]  [address]    Report Status 10/03/2021 FINAL  Final  Culture, blood (Routine X 2) w Reflex to ID Panel     Status: None   Collection Time: 09/28/21  3:18 PM   Specimen: BLOOD  Result Value Ref Range Status   Specimen Description BLOOD RIGHT ANTECUBITAL  Final   Special Requests   Final    BOTTLES DRAWN AEROBIC AND ANAEROBIC Blood Culture adequate volume   Culture   Final    NO GROWTH 5 DAYS Performed at [facility], [address] [place], [place] [address]    Report Status 10/03/2021 FINAL  Final  MRSA Next Gen by PCR, Nasal     Status: None   Collection Time: 09/29/21 12:00 AM   Specimen: Nasal Mucosa; Nasal Swab  Result Value Ref Range Status   MRSA by PCR Next Gen NOT DETECTED NOT DETECTED Final    Comment: (NOTE) The GeneXpert MRSA Assay (FDA approved for NASAL specimens only), is one component of a comprehensive MRSA colonization surveillance program. It is not intended to diagnose MRSA infection nor to guide or monitor treatment for MRSA infections. Test performance is not FDA approved in patients less than 34 years old. Performed at [facility], [address], [place], [place] [address]   CSF culture w Gram Stain     Status: None   Collection Time: 09/30/21  9:36 AM   Specimen: CSF  Result Value Ref Range Status   Specimen Description   Final    CSF Performed at [facility], [address], [place], [place] [address]    Special Requests   Final    NONE Performed at [facility], [address] [place], [place] [address]    Gram Stain   Final    NO ORGANISMS SEEN WBC PRESENT,BOTH PMN AND MONONUCLEAR CYTOSPIN SMEAR   Culture   Final    NO GROWTH Performed at [facility], [place] [address] [place], [place] [address]    Report Status 10/03/2021 FINAL  Final         Radiology Studies: No results found.      Scheduled Meds:  acetaminophen  650 mg Oral Once   Chlorhexidine Gluconate Cloth  6 each Topical Daily   enoxaparin (LOVENOX) injection  80 mg  Subcutaneous Q12H   haloperidol lactate  0.5 mg Intravenous Q6H   insulin aspart  0-20 Units Subcutaneous TID WC   insulin aspart  0-5 Units Subcutaneous QHS   lisinopril  20 mg Oral Daily   potassium chloride  40 mEq Oral Once   QUEtiapine  25 mg Oral BID     LOS: 7 days    Time spent: 35 minutes    Yocheved Depner Darleen Crocker, DO [facility]  If 7PM-7AM, please contact night-coverage [email or web address] 10/05/2021, 9:06 AM

## 2021-10-06 DIAGNOSIS — A419 Sepsis, unspecified organism: Secondary | ICD-10-CM | POA: Diagnosis not present

## 2021-10-06 DIAGNOSIS — Z7189 Other specified counseling: Secondary | ICD-10-CM | POA: Diagnosis not present

## 2021-10-06 DIAGNOSIS — G9340 Encephalopathy, unspecified: Secondary | ICD-10-CM | POA: Diagnosis not present

## 2021-10-06 DIAGNOSIS — R410 Disorientation, unspecified: Secondary | ICD-10-CM | POA: Diagnosis not present

## 2021-10-06 DIAGNOSIS — R6520 Severe sepsis without septic shock: Secondary | ICD-10-CM | POA: Diagnosis not present

## 2021-10-06 DIAGNOSIS — I2690 Septic pulmonary embolism without acute cor pulmonale: Secondary | ICD-10-CM | POA: Diagnosis not present

## 2021-10-06 LAB — CBC
HCT: 39 % (ref 39.0–52.0)
Hemoglobin: 13.2 g/dL (ref 13.0–17.0)
MCH: 33.3 pg (ref 26.0–34.0)
MCHC: 33.8 g/dL (ref 30.0–36.0)
MCV: 98.5 fL (ref 80.0–100.0)
Platelets: 190 10*3/uL (ref 150–400)
RBC: 3.96 MIL/uL — ABNORMAL LOW (ref 4.22–5.81)
RDW: 13.1 % (ref 11.5–15.5)
WBC: 6.6 10*3/uL (ref 4.0–10.5)
nRBC: 0 % (ref 0.0–0.2)

## 2021-10-06 LAB — BASIC METABOLIC PANEL
Anion gap: 4 — ABNORMAL LOW (ref 5–15)
BUN: 15 mg/dL (ref 8–23)
CO2: 26 mmol/L (ref 22–32)
Calcium: 8.1 mg/dL — ABNORMAL LOW (ref 8.9–10.3)
Chloride: 110 mmol/L (ref 98–111)
Creatinine, Ser: 0.59 mg/dL — ABNORMAL LOW (ref 0.61–1.24)
GFR, Estimated: >60 mL/min (ref >60)
Glucose, Bld: 152 mg/dL — ABNORMAL HIGH (ref 70–99)
Potassium: 3.4 mmol/L — ABNORMAL LOW (ref 3.5–5.1)
Sodium: 140 mmol/L (ref 135–145)

## 2021-10-06 LAB — GLUCOSE, CAPILLARY
Glucose-Capillary: 151 mg/dL — ABNORMAL HIGH (ref 70–99)
Glucose-Capillary: 208 mg/dL — ABNORMAL HIGH (ref 70–99)
Glucose-Capillary: 150 mg/dL — ABNORMAL HIGH (ref 70–99)
Glucose-Capillary: 131 mg/dL — ABNORMAL HIGH (ref 70–99)

## 2021-10-06 LAB — MAGNESIUM: Magnesium: 1.8 mg/dL (ref 1.7–2.4)

## 2021-10-06 MED ORDER — POTASSIUM CHLORIDE CRYS ER 20 MEQ PO TBCR
40.0000 meq | EXTENDED_RELEASE_TABLET | Freq: Two times a day (BID) | ORAL | Status: AC
  Administered 2021-10-06 (×2): 40 meq via ORAL
  Filled 2021-10-06 (×2): qty 2

## 2021-10-06 NOTE — TOC Progression Note (Signed)
Transition of Care [facility]) - Progression Note    Patient Details  Name: DONTRAE MORINI MRN: [id number] Date of Birth: [birth year]  [facility] CM/SW Contact  Salome Arnt, [place] Phone Number: 10/06/2021, 9:57 AM  Clinical Narrative:  Per CMA, auth good through Wednesday. Possible d/c tomorrow. [place] updated. TOC will continue to follow.      Expected Discharge Plan: [place] Barriers to Discharge: Continued Medical Work up  Expected Discharge Plan and Services Expected Discharge Plan: [place]       Living arrangements for the past 2 months: Single Family Home                                       Social Determinants of Health (SDOH) Interventions    Readmission Risk Interventions    10/03/2021   11:22 AM 10/02/2021    3:18 PM  Readmission Risk Prevention Plan  Post Dischage Appt Complete Not Complete  Medication Screening Complete Complete  Transportation Screening Complete Complete

## 2021-10-06 NOTE — Evaluation (Signed)
Clinical/Bedside Swallow Evaluation Patient Details  Name: Calvin Mills MRN: [id number] Date of Birth: February 15, 1941  Today's Date: 10/06/2021 Time: SLP Start Time (ACUTE ONLY): 1605 SLP Stop Time (ACUTE ONLY): 1628 SLP Time Calculation (min) (ACUTE ONLY): 23 min  Past Medical History:  Past Medical History:  Diagnosis Date   Diabetes ([place])    High cholesterol    Hypertension    Kidney stone    Past Surgical History:  Past Surgical History:  Procedure Laterality Date   COLONOSCOPY N/A 05/02/2014   Procedure: COLONOSCOPY;  Surgeon: Rogene Houston, MD;  [facility];  Service: Endoscopy;  Laterality: N/A;  145 - moved to 9:30 - Ann notified pt   COLONOSCOPY N/A 05/14/2015   Procedure: COLONOSCOPY;  Surgeon: Rogene Houston, MD;  Location: [facility];  Service: Endoscopy;  Laterality: N/A;  9:30   EYE SURGERY     15 yrs ago: torn retina   TONSILLECTOMY     HPI:  81 year old male who was in his usual state of health on 5/13, was found by his son with severe altered mental status/unresponsive on 5/14.  He was brought to the ER where he was noted to be febrile and a temperature of 103 F.  There was no clear source of infection.  Started on intravenous antibiotics.  Further infectious work-up is underway.  Also noted to have right-sided pulmonary emboli, started on anticoagulation.  He continues to have ongoing symptoms of delirium.  Palliative consulted on 5/21 as he appeared to be a candidate for hospice, but he is now more awake and alert and may be able to go to SNF.    Assessment / Plan / Recommendation  Clinical Impression  Clinical swallow evaluation completed at bedside with family present. Pt with mild breathiness/reduced vocal intensity, coated tongue, meatloaf residuals from lunch on tongue, and sparse dentition in poor repair. Pt reports mild pain/soreness to left upper canine tooth (appears broken off). Pt slowly self presented thin water via cup/straw and graham  crackers (SLP presented puree) without signs of reduced airway protection. Pt with mild lingual residuals and benefited from cues to swish and swallow water. Current diet of dysphagia 3 and thin liquids continues to be appropriate for Pt. Recommend f/u SLP services at SNF for advancement to regular textures when/if appropriate as Pt with much improved alertness today. SLP Visit Diagnosis: Dysphagia, unspecified (R13.10)    Aspiration Risk  Mild aspiration risk    Diet Recommendation Dysphagia 3 (Mech soft);Thin liquid   Liquid Administration via: Cup;Straw Medication Administration: Whole meds with liquid Supervision: Patient able to self feed;Intermittent supervision to cue for compensatory strategies Compensations: Slow rate;Small sips/bites;Lingual sweep for clearance of pocketing Postural Changes: Seated upright at 90 degrees;Remain upright for at least 30 minutes after po intake    Other  Recommendations Oral Care Recommendations: Oral care BID;Staff/trained caregiver to provide oral care Other Recommendations: Clarify dietary restrictions    Recommendations for follow up therapy are one component of a multi-disciplinary discharge planning process, led by the attending physician.  Recommendations may be updated based on patient status, additional functional criteria and insurance authorization.  Follow up Recommendations Skilled nursing-short term rehab (<3 hours/day)      Assistance Recommended at Discharge Intermittent Supervision/Assistance  Functional Status Assessment Patient has had a recent decline in their functional status and demonstrates the ability to make significant improvements in function in a reasonable and predictable amount of time.  Frequency and Duration min 2x/week  1 week  Prognosis Prognosis for Safe Diet Advancement: Good      Swallow Study   General Date of Onset: 09/28/21 HPI: 81 year old male who was in his usual state of health on 5/13, was  found by his son with severe altered mental status/unresponsive on 5/14.  He was brought to the ER where he was noted to be febrile and a temperature of 103 F.  There was no clear source of infection.  Started on intravenous antibiotics.  Further infectious work-up is underway.  Also noted to have right-sided pulmonary emboli, started on anticoagulation.  He continues to have ongoing symptoms of delirium.  Palliative consulted on 5/21 as he appeared to be a candidate for hospice, but he is now more awake and alert and may be able to go to SNF. Type of Study: Bedside Swallow Evaluation Previous Swallow Assessment: N/A Diet Prior to this Study: Dysphagia 3 (soft);Thin liquids Temperature Spikes Noted: No Respiratory Status: Room air History of Recent Intubation: No Behavior/Cognition: Alert;Cooperative;Pleasant mood;Requires cueing Oral Cavity Assessment: Other (comment) (coating on tongue) Oral Care Completed by SLP: Yes Oral Cavity - Dentition: Poor condition;Missing dentition Vision: Functional for self-feeding Self-Feeding Abilities: Needs set up Patient Positioning: Upright in bed Baseline Vocal Quality: Normal;Low vocal intensity Volitional Cough: Strong Volitional Swallow: Able to elicit    Oral/Motor/Sensory Function Overall Oral Motor/Sensory Function: Within functional limits   Ice Chips Ice chips: Within functional limits Presentation: Spoon   Thin Liquid Thin Liquid: Within functional limits Presentation: Cup;Straw;Self Fed    Nectar Thick Nectar Thick Liquid: Not tested   Honey Thick Honey Thick Liquid: Not tested   Puree Puree: Within functional limits Presentation: Spoon   Solid     Solid: Impaired Presentation: Self Fed Oral Phase Impairments: Impaired mastication Oral Phase Functional Implications: Oral residue;Prolonged oral transit     Thank you,  Genene Churn, [place]  Imir Brumbach 10/06/2021,4:37 PM

## 2021-10-06 NOTE — Consult Note (Signed)
Consultation Note Date: 10/06/2021   Patient Name: Calvin Mills  DOB: [birth year]  MRN: [id number]  Age / Sex: 81 y.o., male  PCP: Redmond School, MD Referring Physician: Rodena Goldmann, DO  Reason for Consultation:  "goals of care, likely need for hospice"  HPI/Patient Profile: 81 y.o. male  with past medical history of DM2, remote CVA, admitted on 09/28/2021 with sepsis related to infection of unknown etiology. He was also found to have a PE.  He has been treated with IV antbiotics and anticoagulation. Sepsis physiology is resolving. Admission has been complicated by encephalopathy- likely hospital delirium. Palliative initially consulted for goals of care, likely need for hospice- however, patient has had significant improvement and family is reporting he is nearing his baseline.    Primary Decision Maker NEXT OF KIN - daughter and son  Discussion: Evaluated patient- he was resting comfortably.  His daugther was at the bedside.  Introduced Palliative medicine. Daughter was familiar with palliative medicine.  Prior to this admission patient was living independently at home- he was driving and was planning on going to get his license renewed the week he was admitted.  Daughter shares that patient has had dramatic change in his status and is improving greatly. She is hopeful for him to go temporarily to a nursing facility for rehab and then return home. TOC has begun placement.     SUMMARY OF RECOMMENDATIONS -Continue current level of care- daughter is hopeful for SNF-rehab placement with eventual goal of returning home -He has DNR in place -Palliative will follow and monitor for possible decompensation    Code Status/Advance Care Planning: DNR   Prognosis:   Unable to determine  Discharge Planning: [place] for rehab with Palliative care service follow-up  Primary  Diagnoses: Present on Admission:  Sepsis ([place])  Essential hypertension   Review of Systems  Physical Exam  Vital Signs: BP (!) 146/81 (BP Location: Left Arm)   Pulse 98   Temp 98.6 F (37 C) (Oral)   Resp 18   Ht '5\' 10"'$  (1.778 m)   Wt 70.6 kg   SpO2 100%   BMI 22.33 kg/m  Pain Scale: 0-10   Pain Score: 0-No pain   SpO2: SpO2: 100 % O2 Device:SpO2: 100 % O2 Flow Rate: .O2 Flow Rate (L/min): 2 L/min  IO: Intake/output summary:  Intake/Output Summary (Last 24 hours) at 10/06/2021 1427 Last data filed at 10/06/2021 1124 Gross per 24 hour  Intake 240 ml  Output 500 ml  Net -260 ml    LBM: Last BM Date : 09/28/21 Baseline Weight: Weight: 72.5 kg Most recent weight: Weight: 70.6 kg       Thank you for this consult. Palliative medicine will continue to follow and assist as needed.  Time Total: 60 mins Greater than 50%  of this time was spent counseling and coordinating care related to the above assessment and plan.  Signed by: Mariana Kaufman, AGNP-C Palliative Medicine    Please contact Palliative Medicine Team phone at [phone] for  questions and concerns.  For individual provider: See Shea Evans

## 2021-10-06 NOTE — TOC Progression Note (Signed)
Transition of Care [facility]) - Progression Note    Patient Details  Name: Calvin Mills MRN: [id number] Date of Birth: 10-13-1940  Transition of Care [facility]) CM/SW Contact  Salome Arnt, [place] Phone Number: 10/06/2021, 3:39 PM  Clinical Narrative:  Per family, they would like to have pt closer to them in [place]. Requesting [facility]. LCSW spoke with admissions and they can offer bed. CMA updating authorization. [facility] notified.     Expected Discharge Plan: [place] Barriers to Discharge: Continued Medical Work up  Expected Discharge Plan and Services Expected Discharge Plan: [place]       Living arrangements for the past 2 months: Single Family Home                                       Social Determinants of Health (SDOH) Interventions    Readmission Risk Interventions    10/03/2021   11:22 AM 10/02/2021    3:18 PM  Readmission Risk Prevention Plan  Post Dischage Appt Complete Not Complete  Medication Screening Complete Complete  Transportation Screening Complete Complete

## 2021-10-06 NOTE — Progress Notes (Signed)
PROGRESS NOTE    Calvin Mills  HWE:[id number] DOB: [birth year] DOA: 09/28/2021 PCP: Redmond School, MD   Brief Narrative:    81 year old male who was in his usual state of health on 5/13, was found by his son with severe altered mental status/unresponsive on 5/14.  He was brought to the ER where he was noted to be febrile and a temperature of 103 F.  There was no clear source of infection.  Started on intravenous antibiotics.  Further infectious work-up is underway.  Also noted to have right-sided pulmonary emboli, started on anticoagulation.  He continues to have ongoing symptoms of delirium.  Palliative consulted on 5/21 as he appeared to be a candidate for hospice, but he is now more awake and alert and may be able to go to SNF.  Assessment & Plan:   Principal Problem:   Sepsis ([place]) Active Problems:   Acute pulmonary embolus (HCC)   Acute respiratory failure with hypoxia (HCC)   Uncontrolled type 2 diabetes mellitus with hyperglycemia, with long-term current use of insulin (HCC)   Atrial fibrillation with rapid ventricular response (HCC)   Essential hypertension   Hypokalemia   Acute delirium  Assessment and Plan:   Sepsis, present on admission -Etiology is unclear, although concern for CNS infection -Lactic acid noted to be elevated on admission, improved with IV fluids -Noted to be febrile, tachycardic, tachypneic on admission -Blood cultures have not shown any growth as of yet -He has been started on intravenous antibiotics -Urinalysis is not convincing for infection -Chest x-ray/CT did not underlying pneumonia -Infectious disease following -Lumbar puncture today with initial results not indicating overwhelming infectious process -Culture results negative   Acute encephalopathy, with delirium -Related to sepsis, fever 103F on admission -MRI brain did not show any acute findings -EEG did not show any epileptiform discharges -ABG did not show any hypercarbia -Ammonia  only minimally elevated -No recent changes in medication -He is not on any sedatives at home, he is not on SSRIs -Change in mental status was relatively sudden, was last in usual state of health 5/13, and by 5/14 he was unresponsive -Lumbar puncture done 5/16 with initial studies not terribly convincing of underlying infection -Continue on Rocephin through 5/20 and acyclovir discontinued -Initial Gram stain results with no significant findings and culture pending -Now more alert and awake, pursue SLP eval and Dys 3 diet for now -Appreciate pall eval   Acute pulmonary embolus -Have right-sided PE on CT imaging -Started on heparin infusion, pharmacy will transition to Lovenox -Echo without evidence of right-sided heart strain -Venous Dopplers of lower extremities negative for DVT -We will eventually transition to DOAC when condition stabilizes   Atrial fibrillation -Continue beta-blockers for rate control -Currently on anticoagulation with heparin infusion -Echocardiogram shows preserved ejection fraction   Diabetes -Continue on sliding scale insulin -Blood sugars elevated on admission, he was only taking oral agents prior to admission -Blood sugars currently borderline elevated, will follow -Follow-up A1c 10.4%   Hypertension-stable -Chronically on lisinopril/hydrochlorothiazide -Holding now since he is n.p.o. -Use labetalol IV as needed   Hypokalemia -Replace   Acute delirium -With hallucinations -Monitor closely and started Seroquel 5/18, now increased to twice daily -Scheduled Haldol for 48 hours -High doses of Haldol every 6 hours as needed     DVT prophylaxis:   Full dose Lovenox   Code Status: DNR Family Communication: Updated daughter at the bedside 5/22 Disposition Plan: Status is: Inpatient Remains inpatient appropriate because: Remains with acute delirium and altered  mentation.      Consultants:  Infectious disease Palliative care   Procedures:   LP    Antimicrobials:  Anti-infectives (From admission, onward)    Start     Dose/Rate Route Frequency Ordered Stop   10/01/21 1004  cefTRIAXone (ROCEPHIN) 2 g in sodium chloride 0.9 % 100 mL IVPB        2 g 200 mL/hr over 30 Minutes Intravenous Every 24 hours 09/30/21 1341 10/03/21 1045   09/29/21 1700  acyclovir (ZOVIRAX) 710 mg in dextrose 5 % 250 mL IVPB  Status:  Discontinued        10 mg/kg  70.9 kg 264.2 mL/hr over 60 Minutes Intravenous Every 8 hours 09/29/21 1503 10/02/21 1245   09/29/21 1600  ampicillin (OMNIPEN) 2 g in sodium chloride 0.9 % 100 mL IVPB  Status:  Discontinued        2 g 300 mL/hr over 20 Minutes Intravenous Every 4 hours 09/29/21 1452 09/30/21 1341   09/29/21 1600  cefTRIAXone (ROCEPHIN) 2 g in sodium chloride 0.9 % 100 mL IVPB  Status:  Discontinued        2 g 200 mL/hr over 30 Minutes Intravenous Every 12 hours 09/29/21 1452 09/30/21 1341   09/29/21 1200  ceFEPIme (MAXIPIME) 2 g in sodium chloride 0.9 % 100 mL IVPB  Status:  Discontinued        2 g 200 mL/hr over 30 Minutes Intravenous Every 8 hours 09/29/21 1051 09/29/21 1452   09/29/21 0600  vancomycin (VANCOREADY) IVPB 750 mg/150 mL  Status:  Discontinued        750 mg 150 mL/hr over 60 Minutes Intravenous Every 12 hours 09/28/21 1555 09/30/21 1341   09/29/21 0500  ceFEPIme (MAXIPIME) 2 g in sodium chloride 0.9 % 100 mL IVPB  Status:  Discontinued        2 g 200 mL/hr over 30 Minutes Intravenous Every 12 hours 09/28/21 1555 09/29/21 1051   09/29/21 0300  metroNIDAZOLE (FLAGYL) IVPB 500 mg  Status:  Discontinued        500 mg 100 mL/hr over 60 Minutes Intravenous Every 12 hours 09/28/21 2156 09/29/21 1452   09/28/21 2200  ceFEPIme (MAXIPIME) 2 g in sodium chloride 0.9 % 100 mL IVPB  Status:  Discontinued        2 g 200 mL/hr over 30 Minutes Intravenous  Once 09/28/21 2156 09/28/21 2159   09/28/21 2200  vancomycin (VANCOCIN) IVPB 1000 mg/200 mL premix  Status:  Discontinued        1,000 mg 200 mL/hr over 60  Minutes Intravenous  Once 09/28/21 2156 09/28/21 2159   09/28/21 1600  vancomycin (VANCOREADY) IVPB 1500 mg/300 mL        1,500 mg 150 mL/hr over 120 Minutes Intravenous  Once 09/28/21 1550 09/28/21 1835   09/28/21 1530  ceFEPIme (MAXIPIME) 2 g in sodium chloride 0.9 % 100 mL IVPB        2 g 200 mL/hr over 30 Minutes Intravenous  Once 09/28/21 1523 09/28/21 1631   09/28/21 1530  vancomycin (VANCOCIN) IVPB 1000 mg/200 mL premix  Status:  Discontinued        1,000 mg 200 mL/hr over 60 Minutes Intravenous  Once 09/28/21 1523 09/28/21 1550   09/28/21 1515  metroNIDAZOLE (FLAGYL) IVPB 500 mg        500 mg 100 mL/hr over 60 Minutes Intravenous  Once 09/28/21 1503 09/28/21 1630       Subjective: Patient seen and evaluated today with  no new acute complaints or concerns. No acute concerns or events noted overnight.  He is more awake and alert and family members are at bedside stating that he is almost at baseline.  Objective: Vitals:   10/05/21 1306 10/05/21 2032 10/06/21 0507 10/06/21 0555  BP: 119/66 (!) 157/96 (!) 141/80   Pulse: 95 99 96   Resp: '16 16 18   '$ Temp: 97.7 F (36.5 C) 98.7 F (37.1 C) 98.6 F (37 C)   TempSrc: Oral Oral Oral   SpO2: 100% 94% 96%   Weight:    70.6 kg  Height:        Intake/Output Summary (Last 24 hours) at 10/06/2021 0945 Last data filed at 10/05/2021 1500 Gross per 24 hour  Intake 120 ml  Output 450 ml  Net -330 ml   Filed Weights   10/01/21 0500 10/02/21 0500 10/06/21 0555  Weight: 74.1 kg 77 kg 70.6 kg    Examination:  General exam: Appears calm and comfortable  Respiratory system: Clear to auscultation. Respiratory effort normal. Cardiovascular system: S1 & S2 heard, RRR.  Gastrointestinal system: Abdomen is soft Central nervous system: Alert and awake Extremities: No edema Skin: No significant lesions noted Psychiatry: Flat affect.    Data Reviewed: I have personally reviewed following labs and imaging studies  CBC: Recent Labs   Lab 10/02/21 0435 10/03/21 0508 10/04/21 0426 10/05/21 0442 10/06/21 0322  WBC 6.4 8.5 9.9 8.5 6.6  HGB 12.8* 13.2 13.3 13.3 13.2  HCT 38.8* 40.2 40.5 39.4 39.0  MCV 95.3 95.5 97.1 98.5 98.5  PLT 110* 128* 157 162 824   Basic Metabolic Panel: Recent Labs  Lab 10/02/21 0435 10/03/21 0508 10/04/21 0426 10/05/21 0442 10/06/21 0322  NA 141 143 142 142 140  K 3.2* 2.9* 3.0* 3.4* 3.4*  CL 111 111 107 109 110  CO2 '23 23 24 25 26  '$ GLUCOSE 236* 163* 151* 129* 152*  BUN '12 8 9 14 15  '$ CREATININE 0.58* 0.56* 0.51* 0.70 0.59*  CALCIUM 8.0* 8.2* 8.2* 8.2* 8.1*  MG 1.7 1.5* 1.6* 2.1 1.8   GFR: Estimated Creatinine Clearance: 72.3 mL/min (A) (by C-G formula based on SCr of 0.59 mg/dL (L)). Liver Function Tests: Recent Labs  Lab 09/30/21 0557 10/01/21 0422  AST 32 31  ALT 25 24  ALKPHOS 53 54  BILITOT 1.4* 0.9  PROT 6.1* 5.6*  ALBUMIN 3.2* 2.8*   No results for input(s): LIPASE, AMYLASE in the last 168 hours. Recent Labs  Lab 09/29/21 1728 10/04/21 0731  AMMONIA 47* 14   Coagulation Profile: No results for input(s): INR, PROTIME in the last 168 hours. Cardiac Enzymes: No results for input(s): CKTOTAL, CKMB, CKMBINDEX, TROPONINI in the last 168 hours. BNP (last 3 results) No results for input(s): PROBNP in the last 8760 hours. HbA1C: No results for input(s): HGBA1C in the last 72 hours. CBG: Recent Labs  Lab 10/05/21 0721 10/05/21 1147 10/05/21 1634 10/05/21 2108 10/06/21 0741  GLUCAP 138* 178* 109* 178* 151*   Lipid Profile: No results for input(s): CHOL, HDL, LDLCALC, TRIG, CHOLHDL, LDLDIRECT in the last 72 hours. Thyroid Function Tests: No results for input(s): TSH, T4TOTAL, FREET4, T3FREE, THYROIDAB in the last 72 hours. Anemia Panel: No results for input(s): VITAMINB12, FOLATE, FERRITIN, TIBC, IRON, RETICCTPCT in the last 72 hours. Sepsis Labs: Recent Labs  Lab 09/29/21 1728  LATICACIDVEN 1.5    Recent Results (from the past 240 hour(s))  Resp  Panel by RT-PCR (Flu A&B, Covid) Nasopharyngeal Swab  Status: None   Collection Time: 09/28/21  3:02 PM   Specimen: Nasopharyngeal Swab; Nasopharyngeal(NP) swabs in vial transport medium  Result Value Ref Range Status   SARS Coronavirus 2 by RT PCR NEGATIVE NEGATIVE Final    Comment: (NOTE) SARS-CoV-2 target nucleic acids are NOT DETECTED.  The SARS-CoV-2 RNA is generally detectable in upper respiratory specimens during the acute phase of infection. The lowest concentration of SARS-CoV-2 viral copies this assay can detect is 138 copies/mL. A negative result does not preclude SARS-Cov-2 infection and should not be used as the sole basis for treatment or other patient management decisions. A negative result may occur with  improper specimen collection/handling, submission of specimen other than nasopharyngeal swab, presence of viral mutation(s) within the areas targeted by this assay, and inadequate number of viral copies(<138 copies/mL). A negative result must be combined with clinical observations, patient history, and epidemiological information. The expected result is Negative.  Fact Sheet for Patients:  EntrepreneurPulse.com.au  Fact Sheet for Healthcare Providers:  IncredibleEmployment.be  This test is no t yet approved or cleared by the [place] FDA and  has been authorized for detection and/or diagnosis of SARS-CoV-2 by FDA under an Emergency Use Authorization (EUA). This EUA will remain  in effect (meaning this test can be used) for the duration of the COVID-19 declaration under Section 564(b)(1) of the Act, 21 U.S.C.section 360bbb-3(b)(1), unless the authorization is terminated  or revoked sooner.       Influenza A by PCR NEGATIVE NEGATIVE Final   Influenza B by PCR NEGATIVE NEGATIVE Final    Comment: (NOTE) The Xpert Xpress SARS-CoV-2/FLU/RSV plus assay is intended as an aid in the diagnosis of influenza from Nasopharyngeal  swab specimens and should not be used as a sole basis for treatment. Nasal washings and aspirates are unacceptable for Xpert Xpress SARS-CoV-2/FLU/RSV testing.  Fact Sheet for Patients: EntrepreneurPulse.com.au  Fact Sheet for Healthcare Providers: IncredibleEmployment.be  This test is not yet approved or cleared by the [place] FDA and has been authorized for detection and/or diagnosis of SARS-CoV-2 by FDA under an Emergency Use Authorization (EUA). This EUA will remain in effect (meaning this test can be used) for the duration of the COVID-19 declaration under Section 564(b)(1) of the Act, 21 U.S.C. section 360bbb-3(b)(1), unless the authorization is terminated or revoked.  Performed at [facility], [address], [place], [place] [address]   Urine Culture     Status: Abnormal   Collection Time: 09/28/21  3:02 PM   Specimen: In/Out Cath Urine  Result Value Ref Range Status   Specimen Description   Final    IN/OUT CATH URINE Performed at [facility], [address], [place], [place] [address]    Special Requests   Final    NONE Performed at [facility], [address], [place], [place] [address]    Culture MULTIPLE SPECIES PRESENT, SUGGEST RECOLLECTION (A)  Final   Report Status 09/30/2021 FINAL  Final  Culture, blood (Routine X 2) w Reflex to ID Panel     Status: None   Collection Time: 09/28/21  3:18 PM   Specimen: BLOOD  Result Value Ref Range Status   Specimen Description BLOOD BLOOD LEFT FOREARM  Final   Special Requests   Final    BOTTLES DRAWN AEROBIC AND ANAEROBIC Blood Culture adequate volume   Culture   Final    NO GROWTH 5 DAYS Performed at [facility], [address], [place], [place] [address]    Report Status 10/03/2021  FINAL  Final  Culture, blood (Routine X 2) w Reflex to ID Panel     Status: None   Collection Time: 09/28/21  3:18 PM   Specimen: BLOOD  Result Value Ref Range Status   Specimen Description  BLOOD RIGHT ANTECUBITAL  Final   Special Requests   Final    BOTTLES DRAWN AEROBIC AND ANAEROBIC Blood Culture adequate volume   Culture   Final    NO GROWTH 5 DAYS Performed at [facility], [address], [place], [place] [address]    Report Status 10/03/2021 FINAL  Final  MRSA Next Gen by PCR, Nasal     Status: None   Collection Time: 09/29/21 12:00 AM   Specimen: Nasal Mucosa; Nasal Swab  Result Value Ref Range Status   MRSA by PCR Next Gen NOT DETECTED NOT DETECTED Final    Comment: (NOTE) The GeneXpert MRSA Assay (FDA approved for NASAL specimens only), is one component of a comprehensive MRSA colonization surveillance program. It is not intended to diagnose MRSA infection nor to guide or monitor treatment for MRSA infections. Test performance is not FDA approved in patients less than 3 years old. Performed at [facility], [address], [place], [place] [address]   CSF culture w Gram Stain     Status: None   Collection Time: 09/30/21  9:36 AM   Specimen: CSF  Result Value Ref Range Status   Specimen Description   Final    CSF Performed at [facility], [address] [place], [place] [address]    Special Requests   Final    NONE Performed at [facility], [address] [place], [place] [address]    Gram Stain   Final    NO ORGANISMS SEEN WBC PRESENT,BOTH PMN AND MONONUCLEAR CYTOSPIN SMEAR   Culture   Final    NO GROWTH Performed at [facility], [place] [address], [place], [place] [address]    Report Status 10/03/2021 FINAL  Final         Radiology Studies: No results found.      Scheduled Meds:  acetaminophen  650 mg Oral Once   Chlorhexidine Gluconate Cloth  6 each Topical Daily   enoxaparin (LOVENOX) injection  80 mg Subcutaneous Q12H   haloperidol lactate  0.5 mg Intravenous Q6H   insulin aspart  0-20 Units Subcutaneous TID WC   insulin aspart  0-5 Units Subcutaneous QHS   lisinopril  20 mg Oral Daily   potassium chloride  40 mEq Oral BID    QUEtiapine  25 mg Oral BID     LOS: 8 days    Time spent: 35 minutes    Jamelah Sitzer Darleen Crocker, DO [facility]  If 7PM-7AM, please contact night-coverage [email or web address] 10/06/2021, 9:45 AM

## 2021-10-07 DIAGNOSIS — E1165 Type 2 diabetes mellitus with hyperglycemia: Secondary | ICD-10-CM | POA: Diagnosis not present

## 2021-10-07 DIAGNOSIS — E559 Vitamin D deficiency, unspecified: Secondary | ICD-10-CM | POA: Diagnosis not present

## 2021-10-07 DIAGNOSIS — R69 Illness, unspecified: Secondary | ICD-10-CM | POA: Diagnosis not present

## 2021-10-07 DIAGNOSIS — R1311 Dysphagia, oral phase: Secondary | ICD-10-CM | POA: Diagnosis not present

## 2021-10-07 DIAGNOSIS — I2699 Other pulmonary embolism without acute cor pulmonale: Secondary | ICD-10-CM | POA: Diagnosis not present

## 2021-10-07 DIAGNOSIS — R2681 Unsteadiness on feet: Secondary | ICD-10-CM | POA: Diagnosis not present

## 2021-10-07 DIAGNOSIS — G4700 Insomnia, unspecified: Secondary | ICD-10-CM | POA: Diagnosis not present

## 2021-10-07 DIAGNOSIS — Z741 Need for assistance with personal care: Secondary | ICD-10-CM | POA: Diagnosis not present

## 2021-10-07 DIAGNOSIS — A419 Sepsis, unspecified organism: Secondary | ICD-10-CM | POA: Diagnosis not present

## 2021-10-07 DIAGNOSIS — I2609 Other pulmonary embolism with acute cor pulmonale: Secondary | ICD-10-CM | POA: Diagnosis not present

## 2021-10-07 DIAGNOSIS — E876 Hypokalemia: Secondary | ICD-10-CM | POA: Diagnosis not present

## 2021-10-07 DIAGNOSIS — G9340 Encephalopathy, unspecified: Secondary | ICD-10-CM | POA: Diagnosis not present

## 2021-10-07 DIAGNOSIS — J9601 Acute respiratory failure with hypoxia: Secondary | ICD-10-CM | POA: Diagnosis not present

## 2021-10-07 DIAGNOSIS — M6281 Muscle weakness (generalized): Secondary | ICD-10-CM | POA: Diagnosis not present

## 2021-10-07 DIAGNOSIS — M6259 Muscle wasting and atrophy, not elsewhere classified, multiple sites: Secondary | ICD-10-CM | POA: Diagnosis not present

## 2021-10-07 DIAGNOSIS — R41841 Cognitive communication deficit: Secondary | ICD-10-CM | POA: Diagnosis not present

## 2021-10-07 DIAGNOSIS — I10 Essential (primary) hypertension: Secondary | ICD-10-CM | POA: Diagnosis not present

## 2021-10-07 DIAGNOSIS — I4891 Unspecified atrial fibrillation: Secondary | ICD-10-CM | POA: Diagnosis not present

## 2021-10-07 DIAGNOSIS — R6520 Severe sepsis without septic shock: Secondary | ICD-10-CM | POA: Diagnosis not present

## 2021-10-07 LAB — CBC
HCT: 42.6 % (ref 39.0–52.0)
Hemoglobin: 13.5 g/dL (ref 13.0–17.0)
MCH: 31.8 pg (ref 26.0–34.0)
MCHC: 31.7 g/dL (ref 30.0–36.0)
MCV: 100.2 fL — ABNORMAL HIGH (ref 80.0–100.0)
Platelets: 193 10*3/uL (ref 150–400)
RBC: 4.25 MIL/uL (ref 4.22–5.81)
RDW: 13.1 % (ref 11.5–15.5)
WBC: 6.6 10*3/uL (ref 4.0–10.5)
nRBC: 0 % (ref 0.0–0.2)

## 2021-10-07 LAB — GLUCOSE, CAPILLARY
Glucose-Capillary: 143 mg/dL — ABNORMAL HIGH (ref 70–99)
Glucose-Capillary: 250 mg/dL — ABNORMAL HIGH (ref 70–99)

## 2021-10-07 LAB — BASIC METABOLIC PANEL
Anion gap: 5 (ref 5–15)
BUN: 11 mg/dL (ref 8–23)
CO2: 25 mmol/L (ref 22–32)
Calcium: 8.2 mg/dL — ABNORMAL LOW (ref 8.9–10.3)
Chloride: 109 mmol/L (ref 98–111)
Creatinine, Ser: 0.57 mg/dL — ABNORMAL LOW (ref 0.61–1.24)
GFR, Estimated: >60 mL/min (ref >60)
Glucose, Bld: 131 mg/dL — ABNORMAL HIGH (ref 70–99)
Potassium: 4.3 mmol/L (ref 3.5–5.1)
Sodium: 139 mmol/L (ref 135–145)

## 2021-10-07 LAB — MAGNESIUM: Magnesium: 1.9 mg/dL (ref 1.7–2.4)

## 2021-10-07 MED ORDER — APIXABAN 5 MG PO TABS: 5.0000 mg | ORAL_TABLET | Freq: Two times a day (BID) | ORAL | 0 refills | Status: AC

## 2021-10-07 MED ORDER — APIXABAN 5 MG PO TABS
10.0000 mg | ORAL_TABLET | Freq: Two times a day (BID) | ORAL | Status: DC
  Administered 2021-10-07: 10 mg via ORAL
  Filled 2021-10-07 (×2): qty 2

## 2021-10-07 MED ORDER — LISINOPRIL 20 MG PO TABS: 20.0000 mg | ORAL_TABLET | Freq: Every day | ORAL | 0 refills | Status: AC

## 2021-10-07 MED ORDER — APIXABAN 5 MG PO TABS
5.0000 mg | ORAL_TABLET | Freq: Two times a day (BID) | ORAL | Status: DC
Start: 2021-10-14 — End: 2021-10-07

## 2021-10-07 MED ORDER — APIXABAN 5 MG PO TABS: 10.0000 mg | ORAL_TABLET | Freq: Two times a day (BID) | ORAL | 0 refills | Status: AC

## 2021-10-07 MED ORDER — QUETIAPINE FUMARATE 25 MG PO TABS: 25.0000 mg | ORAL_TABLET | Freq: Every day | ORAL | 0 refills | Status: AC

## 2021-10-07 NOTE — Discharge Summary (Signed)
Physician Discharge Summary  Calvin Mills TDS:[id number] DOB: [birth year] DOA: 09/28/2021  PCP: Redmond School, MD  Admit date: 09/28/2021  Discharge date: 10/07/2021  Admitted From:Home  Disposition:  SNF  Recommendations for Outpatient Follow-up:  Follow up with PCP in 1-2 weeks Continue on Eliquis as prescribed below for treatment of PE which appears unprovoked Discontinue aspirin and Plavix Discontinue glipizide given inconsistent eating patterns and resume metformin Continue Seroquel at bedtime as prescribed Continue other medications as noted below  Home Health: None  Equipment/Devices: None  Discharge Condition:Stable  CODE STATUS: Full  Diet recommendation: Dysphagia 3 diet  Brief/Interim Summary:  81 year old male with history of type 2 diabetes, atrial fibrillation, hypertension, and dementia who was in his usual state of health on 5/13, was found by his son with severe altered mental status/unresponsive on 5/14.  He was brought to the ER where he was noted to be febrile and a temperature of 103 F.  There is no clear source of infection and he had undergone LP with no noted CNS infection.  He was maintained on acyclovir and Rocephin and HSV studies as well as CSF cultures had returned negative.  He had some associated delirium during the course of his stay but MRI brain and EEG did not demonstrate any acute findings.  He was seen by ID in regards to the possibility of encephalitis or meningitis.  He was also noted to have acute PE on CT imaging and was maintained on heparin infusion and then transition to full dose Lovenox during his stay.  He was maintained on scheduled Haldol as well as Seroquel twice daily which brought him out of his delirium.  He was assessed by PT with recommendations for SNF on discharge and he is currently stable to go there as he appears to have returned to his usual baseline according to family members.  He no longer requires any antibiotics and will  be on Eliquis for his PE as well as atrial fibrillation as noted above.  He will remain on Seroquel at bedtime as prescribed to help with his dementia which appears to be associated with behavioral disturbances.  Discharge Diagnoses:  Principal Problem:   Sepsis ([place]) Active Problems:   Acute pulmonary embolus ([facility])   Acute respiratory failure with hypoxia ([place])   Uncontrolled type 2 diabetes mellitus with hyperglycemia, with long-term current use of insulin ([facility])   Atrial fibrillation with rapid ventricular response ([facility])   Essential hypertension   Hypokalemia   Acute delirium  Principal discharge diagnosis: Acute encephalopathy secondary to sepsis present on admission and associated delirium.  Acute pulmonary embolus.  Discharge Instructions  Discharge Instructions     Diet - low sodium heart healthy   Complete by: As directed    Increase activity slowly   Complete by: As directed       Allergies as of 10/07/2021   No Known Allergies      Medication List     STOP taking these medications    aspirin EC 81 MG tablet   clopidogrel 75 MG tablet Commonly known as: PLAVIX   glipiZIDE 10 MG tablet Commonly known as: GLUCOTROL   lisinopril-hydrochlorothiazide 20-25 MG tablet Commonly known as: ZESTORETIC       TAKE these medications    acetaminophen 325 MG tablet Commonly known as: TYLENOL Take 2 tablets (650 mg total) by mouth every 4 (four) hours as needed for mild pain (or temp > 37.5 C (99.5 F)).   apixaban 5 MG Tabs tablet  Commonly known as: ELIQUIS Take 2 tablets (10 mg total) by mouth 2 (two) times daily for 7 days.   apixaban 5 MG Tabs tablet Commonly known as: ELIQUIS Take 1 tablet (5 mg total) by mouth 2 (two) times daily. Start taking on: Oct 14, 2021   indomethacin 50 MG capsule Commonly known as: INDOCIN Take 50 mg by mouth 2 (two) times daily.   lisinopril 20 MG tablet Commonly known as: ZESTRIL Take 1 tablet (20 mg total) by mouth  daily. Start taking on: Oct 08, 2021   metFORMIN 1000 MG tablet Commonly known as: GLUCOPHAGE Take 1 tablet (1,000 mg total) by mouth 2 (two) times daily with a meal.   QUEtiapine 25 MG tablet Commonly known as: SEROQUEL Take 1 tablet (25 mg total) by mouth at bedtime.        Contact information for follow-up providers     Redmond School, MD. Schedule an appointment as soon as possible for a visit in 1 week(s).   Specialty: Internal Medicine Contact information: [address] [place] [address] [phone]              Contact information for after-discharge care     Destination     [facility] .   Service: Skilled Nursing Contact information: [address] [place] [phone]                    No Known Allergies  Consultations: Infectious disease Palliative care   Procedures/Studies: CT ANGIO HEAD NECK W WO CM  Result Date: 09/28/2021 CLINICAL DATA:  Neuro deficit, acute, stroke suspected. Altered mental status. Found unresponsive today. EXAM: CT ANGIOGRAPHY HEAD AND NECK TECHNIQUE: Multidetector CT imaging of the head and neck was performed using the standard protocol during bolus administration of intravenous contrast. Multiplanar CT image reconstructions and MIPs were obtained to evaluate the vascular anatomy. Carotid stenosis measurements (when applicable) are obtained utilizing NASCET criteria, using the distal internal carotid diameter as the denominator. RADIATION DOSE REDUCTION: This exam was performed according to the departmental dose-optimization program which includes automated exposure control, adjustment of the mA and/or kV according to patient size and/or use of iterative reconstruction technique. CONTRAST:  20m OMNIPAQUE IOHEXOL 350 MG/ML SOLN COMPARISON:  Head and neck MRA 05/29/2021 FINDINGS: CTA NECK FINDINGS Aortic arch: Standard 3 vessel aortic arch with  moderate soft and calcified plaque. Patent brachiocephalic and subclavian arteries with 50% stenosis of the left subclavian origin. Right carotid system: Patent with a mild-to-moderate amount of calcified and soft plaque at the carotid bifurcation. No evidence of a significant stenosis or dissection. Left carotid system: Patent with scattered soft plaque in the common carotid artery and a small to moderate amount of calcified plaque the carotid bifurcation. No evidence of a significant stenosis or dissection. Vertebral arteries: Patent with calcified plaque at the left greater than right vertebral artery origins. No evidence of a significant stenosis or dissection. Codominant. Skeleton: Advanced disc degeneration in the mid and lower cervical spine. Advanced left facet arthrosis at C3-4. Moderate to severe bilateral neural foraminal stenosis at C4-5 due to uncovertebral spurring. Other neck: No evidence of cervical lymphadenopathy or mass. Upper chest: Moderate centrilobular emphysema. Review of the MIP images confirms the above findings CTA HEAD FINDINGS Anterior circulation: The internal carotid arteries are patent from skull base to carotid termini with mild atherosclerotic plaque resulting in at most mild paraclinoid stenosis bilaterally. ACAs and MCAs are patent without evidence of  a proximal branch occlusion or significant proximal stenosis. Incidental note is made of a median artery of the corpus callosum and mildly dominant left ACA. No aneurysm is identified. Posterior circulation: The intracranial vertebral arteries are widely patent to the basilar. Patent PICA and SCA origins are seen bilaterally. The basilar artery is patent with mild irregularity but no significant stenosis. Posterior communicating arteries are diminutive or absent. Both PCAs are patent without evidence of a significant proximal stenosis. No aneurysm is identified. Venous sinuses: Diminutive and poorly visualized left transverse and  left sigmoid sinuses, likely congenital. Other major dural venous sinuses are patent. Anatomic variants: None of significance. Review of the MIP images confirms the above findings IMPRESSION: 1. Mild intracranial atherosclerosis without large vessel occlusion or flow limiting proximal stenosis. 2. Cervical carotid artery atherosclerosis without significant stenosis. 3. 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 4. Aortic Atherosclerosis (ICD10-I70.0) and Emphysema (ICD10-J43.9). Electronically Signed   By: Logan Bores M.D.   On: 09/28/2021 19:24   DG Chest 1 View  Result Date: 09/28/2021 CLINICAL DATA:  Unresponsive, altered mental status EXAM: CHEST  1 VIEW COMPARISON:  09/30/2004 FINDINGS: The heart size and mediastinal contours are within normal limits. Both lungs are clear. The visualized skeletal structures are unremarkable. IMPRESSION: No acute abnormality of the lungs in AP portable projection. Electronically Signed   By: Delanna Ahmadi M.D.   On: 09/28/2021 15:52   CT HEAD WO CONTRAST  Result Date: 09/28/2021 CLINICAL DATA:  Mental status change, persistent or worsening EXAM: CT HEAD WITHOUT CONTRAST TECHNIQUE: Contiguous axial images were obtained from the base of the skull through the vertex without intravenous contrast. RADIATION DOSE REDUCTION: This exam was performed according to the departmental dose-optimization program which includes automated exposure control, adjustment of the mA and/or kV according to patient size and/or use of iterative reconstruction technique. COMPARISON:  CT dated May 28, 2021 FINDINGS: Brain: No evidence of acute infarction, hemorrhage, hydrocephalus, extra-axial collection or mass lesion/mass effect. Periventricular white matter hypodensities consistent with sequela of chronic microvascular ischemic disease. Vascular: Focal high density in the basilar artery is favored to be artifactual due to streak artifact at this level ( series 2, image 10; series 5, image  34). Vascular calcifications. Skull: Normal. Negative for fracture or focal lesion. Sinuses/Orbits: Partial opacification the LEFT sphenoid sinus with high density material likely reflecting inspissated secretions versus chronic fungal colonization. Other: None. IMPRESSION: 1.  No definitive acute intracranial abnormality. 2. Focal high density in the basilar artery is favored to be artifactual due to streak artifact at this level. If there is clinical concern for basilar artery occlusion, then recommend dedicated CTA head. Electronically Signed   By: Valentino Saxon M.D.   On: 09/28/2021 15:55   CT Angio Chest Pulmonary Embolism (PE) W or WO Contrast  Result Date: 09/29/2021 CLINICAL DATA:  Sepsis and positive D-dimer, recently found unresponsive EXAM: CT ANGIOGRAPHY CHEST WITH CONTRAST TECHNIQUE: Multidetector CT imaging of the chest was performed using the standard protocol during bolus administration of intravenous contrast. Multiplanar CT image reconstructions and MIPs were obtained to evaluate the vascular anatomy. RADIATION DOSE REDUCTION: This exam was performed according to the departmental dose-optimization program which includes automated exposure control, adjustment of the mA and/or kV according to patient size and/or use of iterative reconstruction technique. CONTRAST:  73m OMNIPAQUE IOHEXOL 350 MG/ML SOLN COMPARISON:  Chest x-ray from earlier in the same day. FINDINGS: Cardiovascular: Atherosclerotic calcifications of the thoracic aorta are noted. Aneurysmal dilatation of the ascending aorta 4.3  cm is noted. No dissection is seen. Heart is mildly enlarged in size. Coronary calcifications are noted. Pulmonary artery shows a normal branching pattern bilaterally. A few small filling defects are noted within the right upper lobe consistent with pulmonary emboli. No significant right heart strain is noted. Mediastinum/Nodes: Thoracic inlet is within normal limits. No sizable hilar or mediastinal  adenopathy is noted. The esophagus is within normal limits. Lungs/Pleura: Lungs are well aerated bilaterally. Bilateral lower lobe atelectatic changes are seen. Significant emphysematous changes are noted. No parenchymal nodules or sizable effusions are seen. Upper Abdomen: Visualized upper abdomen shows cholelithiasis. No other focal abnormality is noted. Musculoskeletal: Degenerative changes of the thoracic spine are noted. No acute rib abnormality is seen. Review of the MIP images confirms the above findings. IMPRESSION: Mild pulmonary emboli involving the right upper lobe. No significant right heart strain is noted. Cholelithiasis without complicating factors. Dilatation of the ascending aorta to 4.3 cm. Recommend annual imaging followup by CTA or MRA. This recommendation follows 2010 ACCF/AHA/AATS/ACR/ASA/SCA/SCAI/SIR/STS/SVM Guidelines for the Diagnosis and Management of Patients with Thoracic Aortic Disease. Circulation. 2010; 121: X324-M010. Aortic aneurysm NOS (ICD10-I71.9) Mild bibasilar atelectasis. Aortic Atherosclerosis (ICD10-I70.0) and Emphysema (ICD10-J43.9). These results will be called to the ordering clinician or representative by the Radiologist Assistant, and communication documented in the PACS or [employer]. Electronically Signed   By: Inez Catalina M.D.   On: 09/29/2021 01:07   MR ANGIO HEAD WO CONTRAST  Result Date: 09/29/2021 CLINICAL DATA:  Mental status change EXAM: MRI HEAD WITHOUT AND WITH CONTRAST MRA HEAD WITHOUT CONTRAST TECHNIQUE: Multiplanar, multi-echo pulse sequences of the brain and surrounding structures were acquired without and with intravenous contrast. Angiographic images of the Circle of Willis were acquired using MRA technique without intravenous contrast. CONTRAST:  68m GADAVIST GADOBUTROL 1 MMOL/ML IV SOLN COMPARISON:  05/28/2021 MRI head, 05/29/2021 MRA head FINDINGS: MRI HEAD FINDINGS Brain: No restricted diffusion to suggest acute or subacute infarct. No  acute hemorrhage, mass, mass effect, or midline shift. No hydrocephalus or extra-axial collection. No abnormal parenchymal or meningeal enhancement. Degree of cerebral atrophy is within normal limits for age. Confluent T2 hyperintense signal in the periventricular white matter, likely the sequela of moderate chronic small vessel ischemic disease. Vascular: Normal flow voids. Patent venous sinuses on postcontrast imaging. Skull and upper cervical spine: No suspicious osseous lesions. Sinuses/Orbits: No significant mucosal thickening in the paranasal sinuses. The orbits are unremarkable. Other: Fluid in the right mastoid air cells. MRA HEAD FINDINGS Anterior circulation: Both internal carotid arteries are patent to the termini, with multifocal irregularity, which results in mild-to-moderate stenosis of the right cavernous and supraclinoid segments. A1 segments patent, although the right A1 is hypoplastic or moderately stenotic. Normal anterior communicating artery. Anterior cerebral arteries are patent to their distal aspects. No M1 stenosis or occlusion. Normal MCA bifurcations. Redemonstrated moderate to severe stenosis of the proximal right superior M2 (series 13, image 114). Otherwise scattered irregularity in the bilateral distal MCAs without additional high-grade stenosis or occlusion. Posterior circulation: Vertebral arteries patent to the vertebrobasilar junction without stenosis. Posterior inferior cerebral arteries patent bilaterally. Basilar patent to its distal aspect. Superior cerebellar arteries patent bilaterally. Patent P1 segments. PCAs perfused to their distal aspects without focal stenosis with diminished signal in the distal right PCA, likely related to atherosclerotic disease the bilateral posterior communicating arteries are not visualized. Venous sinuses: As permitted by contrast timing, patent. Anatomic variants: None significant. Review of the MIP images confirms the above findings IMPRESSION:  1.  No acute intracranial process. 2. No intracranial large vessel occlusion. Redemonstrated mild-to-moderate stenosis in the right ICA, moderate severe stenosis in the right M2, and poor signal in the distal right PCA, likely mild atherosclerotic disease. Electronically Signed   By: Merilyn Baba M.D.   On: 09/29/2021 19:15   MR BRAIN W WO CONTRAST  Result Date: 09/29/2021 CLINICAL DATA:  Mental status change EXAM: MRI HEAD WITHOUT AND WITH CONTRAST MRA HEAD WITHOUT CONTRAST TECHNIQUE: Multiplanar, multi-echo pulse sequences of the brain and surrounding structures were acquired without and with intravenous contrast. Angiographic images of the Circle of Willis were acquired using MRA technique without intravenous contrast. CONTRAST:  68m GADAVIST GADOBUTROL 1 MMOL/ML IV SOLN COMPARISON:  05/28/2021 MRI head, 05/29/2021 MRA head FINDINGS: MRI HEAD FINDINGS Brain: No restricted diffusion to suggest acute or subacute infarct. No acute hemorrhage, mass, mass effect, or midline shift. No hydrocephalus or extra-axial collection. No abnormal parenchymal or meningeal enhancement. Degree of cerebral atrophy is within normal limits for age. Confluent T2 hyperintense signal in the periventricular white matter, likely the sequela of moderate chronic small vessel ischemic disease. Vascular: Normal flow voids. Patent venous sinuses on postcontrast imaging. Skull and upper cervical spine: No suspicious osseous lesions. Sinuses/Orbits: No significant mucosal thickening in the paranasal sinuses. The orbits are unremarkable. Other: Fluid in the right mastoid air cells. MRA HEAD FINDINGS Anterior circulation: Both internal carotid arteries are patent to the termini, with multifocal irregularity, which results in mild-to-moderate stenosis of the right cavernous and supraclinoid segments. A1 segments patent, although the right A1 is hypoplastic or moderately stenotic. Normal anterior communicating artery. Anterior cerebral arteries  are patent to their distal aspects. No M1 stenosis or occlusion. Normal MCA bifurcations. Redemonstrated moderate to severe stenosis of the proximal right superior M2 (series 13, image 114). Otherwise scattered irregularity in the bilateral distal MCAs without additional high-grade stenosis or occlusion. Posterior circulation: Vertebral arteries patent to the vertebrobasilar junction without stenosis. Posterior inferior cerebral arteries patent bilaterally. Basilar patent to its distal aspect. Superior cerebellar arteries patent bilaterally. Patent P1 segments. PCAs perfused to their distal aspects without focal stenosis with diminished signal in the distal right PCA, likely related to atherosclerotic disease the bilateral posterior communicating arteries are not visualized. Venous sinuses: As permitted by contrast timing, patent. Anatomic variants: None significant. Review of the MIP images confirms the above findings IMPRESSION: 1.  No acute intracranial process. 2. No intracranial large vessel occlusion. Redemonstrated mild-to-moderate stenosis in the right ICA, moderate severe stenosis in the right M2, and poor signal in the distal right PCA, likely mild atherosclerotic disease. Electronically Signed   By: AMerilyn BabaM.D.   On: 09/29/2021 19:15   UKoreaVenous Img Lower Bilateral (DVT)  Result Date: 09/29/2021 CLINICAL DATA:  Pulmonary emboli. EXAM: BILATERAL LOWER EXTREMITY VENOUS DOPPLER ULTRASOUND TECHNIQUE: Gray-scale sonography with graded compression, as well as color Doppler and duplex ultrasound were performed to evaluate the lower extremity deep venous systems from the level of the common femoral vein and including the common femoral, femoral, profunda femoral, popliteal and calf veins including the posterior tibial, peroneal and gastrocnemius veins when visible. The superficial great saphenous vein was also interrogated. Spectral Doppler was utilized to evaluate flow at rest and with distal  augmentation maneuvers in the common femoral, femoral and popliteal veins. COMPARISON:  None Available. FINDINGS: RIGHT LOWER EXTREMITY Common Femoral Vein: No evidence of thrombus. Normal compressibility, respiratory phasicity and response to augmentation. Saphenofemoral Junction: No evidence of thrombus. Normal compressibility and flow  on color Doppler imaging. Profunda Femoral Vein: No evidence of thrombus. Normal compressibility and flow on color Doppler imaging. Femoral Vein: No evidence of thrombus. Normal compressibility, respiratory phasicity and response to augmentation. Popliteal Vein: No evidence of thrombus. Normal compressibility, respiratory phasicity and response to augmentation. Calf Veins: No evidence of thrombus. Normal compressibility and flow on color Doppler imaging. Superficial Great Saphenous Vein: No evidence of thrombus. Normal compressibility. Venous Reflux:  None. Other Findings:  None. LEFT LOWER EXTREMITY Common Femoral Vein: No evidence of thrombus. Normal compressibility, respiratory phasicity and response to augmentation. Saphenofemoral Junction: No evidence of thrombus. Normal compressibility and flow on color Doppler imaging. Profunda Femoral Vein: No evidence of thrombus. Normal compressibility and flow on color Doppler imaging. Femoral Vein: No evidence of thrombus. Normal compressibility, respiratory phasicity and response to augmentation. Popliteal Vein: No evidence of thrombus. Normal compressibility, respiratory phasicity and response to augmentation. Calf Veins: No evidence of thrombus. Normal compressibility and flow on color Doppler imaging. Superficial Great Saphenous Vein: No evidence of thrombus. Normal compressibility. Venous Reflux:  None. Other Findings:  None. IMPRESSION: No evidence of deep venous thrombosis in either lower extremity. Electronically Signed   By: Marijo Conception M.D.   On: 09/29/2021 13:35   DG CHEST PORT 1 VIEW  Result Date: 09/30/2021 CLINICAL  DATA:  Fever EXAM: PORTABLE CHEST 1 VIEW COMPARISON:  Chest x-ray 09/28/2021 FINDINGS: Mild cardiomegaly unchanged. Mediastinum appears stable. Calcified plaques in the aortic arch. Mild emphysematous changes of the lungs. Linear likely subsegmental atelectasis/scarring at the lung bases. No pleural effusion or pneumothorax. IMPRESSION: Chronic changes with no acute process identified. Electronically Signed   By: Ofilia Neas M.D.   On: 09/30/2021 14:22   EEG adult  Result Date: 09/29/2021 Lora Havens, MD     09/29/2021  4:46 PM Patient Name: Calvin Mills MRN: [id number] Epilepsy Attending: Lora Havens Referring Physician/Provider: Kathie Dike, MD Date: 09/29/2021 Duration: 22.43 mins Patient history: 81yo M with ams. EEG to evaluate for seizure. Level of alertness: lethargic AEDs during EEG study: None Technical aspects: This EEG study was done with scalp electrodes positioned according to the 10-20 International system of electrode placement. Electrical activity was acquired at a sampling rate of '500Hz'$  and reviewed with a high frequency filter of '70Hz'$  and a low frequency filter of '1Hz'$ . EEG data were recorded continuously and digitally stored. Description:No posterior dominant rhythm was seen. EEG showed continuous generalized and lateralized right hemisphere 3 to 6 Hz theta-delta slowing. Hyperventilation and photic stimulation were not performed.   ABNORMALITY - Continuous slow, generalized and lateralized right hemisphere IMPRESSION: This study is suggestive of cortical dysfunction arising from right hemisphere likely secondary to underlying structural abnormality. Additionally there is moderate to severe diffuse encephalopathy, nonspecific etiology. No seizures or epileptiform discharges were seen throughout the recording. Lora Havens   ECHOCARDIOGRAM COMPLETE  Result Date: 09/29/2021    ECHOCARDIOGRAM REPORT   Patient Name:   Calvin Mills Gossard Date of Exam: 09/29/2021 Medical Rec #:   [id number]    Height:       70.0 in Accession #:    [id number]   Weight:       156.3 lb Date of Birth:  [birth year]    BSA:          1.880 m Patient Age:    81 years     BP:           141/68 mmHg Patient Gender: M  HR:           104 bpm. Exam Location:  Inpatient Procedure: 2D Echo, Cardiac Doppler and Color Doppler Indications:    Pulmonary Embolus I26.09  History:        Patient has prior history of Echocardiogram examinations, most                 recent 05/29/2021. Arrythmias:Atrial Fibrillation; Risk                 Factors:Hypertension, Diabetes and Dyslipidemia. Sepsis, Acute                 pulmonary embolus, Altered mental status.  Sonographer:    Darlina Sicilian RDCS Referring Phys: [id number] [place]  1. Left ventricular ejection fraction, by estimation, is 60 to 65%. The left ventricle has normal function. Left ventricular endocardial border not optimally defined to evaluate regional wall motion. There is severe left ventricular hypertrophy. Left ventricular diastolic parameters are consistent with Grade I diastolic dysfunction (impaired relaxation).  2. Right ventricular systolic function is normal. The right ventricular size is normal. Tricuspid regurgitation signal is inadequate for assessing PA pressure.  3. The mitral valve is normal in structure. No evidence of mitral valve regurgitation. No evidence of mitral stenosis.  4. The aortic valve is tricuspid. Aortic valve regurgitation is not visualized. No aortic stenosis is present.  5. Aortic dilatation noted. 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  6. The inferior vena cava is normal in size with greater than 50% respiratory variability, suggesting right atrial pressure of 3 mmHg. FINDINGS  Left Ventricle: Left ventricular ejection fraction, by estimation, is 60 to 65%. The left ventricle has normal function. Left ventricular endocardial  border not optimally defined to evaluate regional wall motion. The left ventricular internal cavity size was normal in size. There is severe left ventricular hypertrophy. Left ventricular diastolic parameters are consistent with Grade I diastolic dysfunction (impaired relaxation). Normal left ventricular filling pressure. Right Ventricle: The right ventricular size is normal. Right vetricular wall thickness was not well visualized. Right ventricular systolic function is normal. Tricuspid regurgitation signal is inadequate for assessing PA pressure. Left Atrium: Left atrial size was normal in size. Right Atrium: Right atrial size was normal in size. Pericardium: There is no evidence of pericardial effusion. Mitral Valve: The mitral valve is normal in structure. No evidence of mitral valve regurgitation. No evidence of mitral valve stenosis. Tricuspid Valve: The tricuspid valve is normal in structure. Tricuspid valve regurgitation is not demonstrated. No evidence of tricuspid stenosis. Aortic Valve: The aortic valve is tricuspid. Aortic valve regurgitation is not visualized. No aortic stenosis is present. Aortic valve mean gradient measures 3.1 mmHg. Aortic valve peak gradient measures 5.8 mmHg. Aortic valve area, by VTI measures 2.99 cm. Pulmonic Valve: The pulmonic valve was not well visualized. Pulmonic valve regurgitation is not visualized. No evidence of pulmonic stenosis. Aorta: Aortic dilatation noted. 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 Venous: The inferior vena cava is normal in size with greater than 50% respiratory variability, suggesting right atrial pressure of 3 mmHg. IAS/Shunts: The interatrial septum was not well visualized.  LEFT VENTRICLE PLAX 2D LVIDd:         3.70 cm   Diastology LVIDs:         2.50 cm  LV e' medial:    4.81 cm/s LV PW:         1.40 cm   LV E/e' medial:  10.9 LV IVS:        1.70 cm   LV e'  lateral:   8.60 cm/s LVOT diam:     2.30 cm   LV E/e' lateral: 6.1 LV SV:         48 LV SV Index:   26 LVOT Area:     4.15 cm  RIGHT VENTRICLE RV S prime:     16.50 cm/s TAPSE (M-mode): 1.5 cm LEFT ATRIUM             Index        RIGHT ATRIUM           Index LA diam:        3.10 cm 1.65 cm/m   RA Area:     11.10 cm LA Vol (A2C):   33.9 ml 18.03 ml/m  RA Volume:   23.00 ml  12.24 ml/m LA Vol (A4C):   32.9 ml 17.50 ml/m LA Biplane Vol: 35.5 ml 18.89 ml/m  AORTIC VALVE AV Area (Vmax):    2.66 cm AV Area (Vmean):   2.54 cm AV Area (VTI):     2.99 cm AV Vmax:           120.06 cm/s AV Vmean:          83.347 cm/s AV VTI:            0.161 m AV Peak Grad:      5.8 mmHg AV Mean Grad:      3.1 mmHg LVOT Vmax:         76.90 cm/s LVOT Vmean:        50.900 cm/s LVOT VTI:          0.116 m LVOT/AV VTI ratio: 0.72  AORTA Ao Root diam: 4.30 cm Ao Asc diam:  4.00 cm MITRAL VALVE MV Area (PHT): 2.26 cm     SHUNTS MV Decel Time: 335 msec     Systemic VTI:  0.12 m MV E velocity: 52.40 cm/s   Systemic Diam: 2.30 cm MV A velocity: 116.00 cm/s MV E/A ratio:  0.45 Carlyle Dolly MD Electronically signed by Carlyle Dolly MD Signature Date/Time: 09/29/2021/12:07:23 PM    Final    DG FL GUIDED LUMBAR PUNCTURE  Result Date: 09/30/2021 CLINICAL DATA:  Fever of unknown origin, altered mental status EXAM: DIAGNOSTIC LUMBAR PUNCTURE UNDER FLUOROSCOPIC GUIDANCE COMPARISON:  CT head 09/28/2021 FLUOROSCOPY: Radiation Exposure Index (as provided by the fluoroscopic device): 16.4 mGy Kerma PROCEDURE: Procedure, benefits, and risks were discussed with the patient's son,. Patient's questions were answered. Written informed consent was obtained. Timeout protocol followed. Patient placed prone. L4-L5 disc space was localized under fluoroscopy. Skin prepped and draped in usual sterile fashion. Skin and soft tissues anesthetized with 5 mL of 1% lidocaine. 22 gauge spinal needle was advanced into the spinal canal where clear colorless CSF was  encountered with an opening pressure of 9 cm H2O (measured prone). 11 mL of CSF was obtained in 4 tubes for requested analysis. Procedure tolerated very well by patient without immediate complication. IMPRESSION: Successful fluoroscopic guided lumbar puncture as above. Electronically Signed   By: Lavonia Dana M.D.   On: 09/30/2021 10:14     Discharge Exam: Vitals:   10/06/21 2220 10/07/21 0413  BP:  (!) 151/88  Pulse: 99 93  Resp: 18 19  Temp:  (!)  97.1 F (36.2 C)  SpO2: 94% 98%   Vitals:   10/06/21 1155 10/06/21 2051 10/06/21 2220 10/07/21 0413  BP: (!) 146/81 (!) 151/79  (!) 151/88  Pulse: 98 92 99 93  Resp: '18 19 18 19  '$ Temp: 98.6 F (37 C) 98 F (36.7 C)  (!) 97.1 F (36.2 C)  TempSrc: Oral     SpO2: 100% (!) 77% 94% 98%  Weight:      Height:        General: Pt is alert, awake, not in acute distress Cardiovascular: RRR, S1/S2 +, no rubs, no gallops Respiratory: CTA bilaterally, no wheezing, no rhonchi Abdominal: Soft, NT, ND, bowel sounds + Extremities: no edema, no cyanosis    The results of significant diagnostics from this hospitalization (including imaging, microbiology, ancillary and laboratory) are listed below for reference.     Microbiology: Recent Results (from the past 240 hour(s))  Resp Panel by RT-PCR (Flu A&B, Covid) Nasopharyngeal Swab     Status: None   Collection Time: 09/28/21  3:02 PM   Specimen: Nasopharyngeal Swab; Nasopharyngeal(NP) swabs in vial transport medium  Result Value Ref Range Status   SARS Coronavirus 2 by RT PCR NEGATIVE NEGATIVE Final    Comment: (NOTE) SARS-CoV-2 target nucleic acids are NOT DETECTED.  The SARS-CoV-2 RNA is generally detectable in upper respiratory specimens during the acute phase of infection. The lowest concentration of SARS-CoV-2 viral copies this assay can detect is 138 copies/mL. A negative result does not preclude SARS-Cov-2 infection and should not be used as the sole basis for treatment or other  patient management decisions. A negative result may occur with  improper specimen collection/handling, submission of specimen other than nasopharyngeal swab, presence of viral mutation(s) within the areas targeted by this assay, and inadequate number of viral copies(<138 copies/mL). A negative result must be combined with clinical observations, patient history, and epidemiological information. The expected result is Negative.  Fact Sheet for Patients:  EntrepreneurPulse.com.au  Fact Sheet for Healthcare Providers:  IncredibleEmployment.be  This test is no t yet approved or cleared by the [place] FDA and  has been authorized for detection and/or diagnosis of SARS-CoV-2 by FDA under an Emergency Use Authorization (EUA). This EUA will remain  in effect (meaning this test can be used) for the duration of the COVID-19 declaration under Section 564(b)(1) of the Act, 21 U.S.C.section 360bbb-3(b)(1), unless the authorization is terminated  or revoked sooner.       Influenza A by PCR NEGATIVE NEGATIVE Final   Influenza B by PCR NEGATIVE NEGATIVE Final    Comment: (NOTE) The Xpert Xpress SARS-CoV-2/FLU/RSV plus assay is intended as an aid in the diagnosis of influenza from Nasopharyngeal swab specimens and should not be used as a sole basis for treatment. Nasal washings and aspirates are unacceptable for Xpert Xpress SARS-CoV-2/FLU/RSV testing.  Fact Sheet for Patients: EntrepreneurPulse.com.au  Fact Sheet for Healthcare Providers: IncredibleEmployment.be  This test is not yet approved or cleared by the [place] FDA and has been authorized for detection and/or diagnosis of SARS-CoV-2 by FDA under an Emergency Use Authorization (EUA). This EUA will remain in effect (meaning this test can be used) for the duration of the COVID-19 declaration under Section 564(b)(1) of the Act, 21 U.S.C. section  360bbb-3(b)(1), unless the authorization is terminated or revoked.  Performed at [facility], [address], [place], [place] [address]   Urine Culture     Status: Abnormal   Collection Time: 09/28/21  3:02 PM  Specimen: In/Out Cath Urine  Result Value Ref Range Status   Specimen Description   Final    IN/OUT CATH URINE Performed at [facility], [address], [place], [place] [address]    Special Requests   Final    NONE Performed at [facility], [address], [place], [place] [address]    Culture MULTIPLE SPECIES PRESENT, SUGGEST RECOLLECTION (A)  Final   Report Status 09/30/2021 FINAL  Final  Culture, blood (Routine X 2) w Reflex to ID Panel     Status: None   Collection Time: 09/28/21  3:18 PM   Specimen: BLOOD  Result Value Ref Range Status   Specimen Description BLOOD BLOOD LEFT FOREARM  Final   Special Requests   Final    BOTTLES DRAWN AEROBIC AND ANAEROBIC Blood Culture adequate volume   Culture   Final    NO GROWTH 5 DAYS Performed at [facility], [address] [place], [place] [address]    Report Status 10/03/2021 FINAL  Final  Culture, blood (Routine X 2) w Reflex to ID Panel     Status: None   Collection Time: 09/28/21  3:18 PM   Specimen: BLOOD  Result Value Ref Range Status   Specimen Description BLOOD RIGHT ANTECUBITAL  Final   Special Requests   Final    BOTTLES DRAWN AEROBIC AND ANAEROBIC Blood Culture adequate volume   Culture   Final    NO GROWTH 5 DAYS Performed at [facility], [address] [place], [place] [address]    Report Status 10/03/2021 FINAL  Final  MRSA Next Gen by PCR, Nasal     Status: None   Collection Time: 09/29/21 12:00 AM   Specimen: Nasal Mucosa; Nasal Swab  Result Value Ref Range Status   MRSA by PCR Next Gen NOT DETECTED NOT DETECTED Final    Comment: (NOTE) The GeneXpert MRSA Assay (FDA approved for NASAL specimens only), is one component of a comprehensive MRSA colonization surveillance program. It is not intended  to diagnose MRSA infection nor to guide or monitor treatment for MRSA infections. Test performance is not FDA approved in patients less than 50 years old. Performed at [facility], [address] [place], [place] [address]   CSF culture w Gram Stain     Status: None   Collection Time: 09/30/21  9:36 AM   Specimen: CSF  Result Value Ref Range Status   Specimen Description   Final    CSF Performed at [facility], [address] [place], [place] [address]    Special Requests   Final    NONE Performed at [facility], [address], [place], [place] [address]    Gram Stain   Final    NO ORGANISMS SEEN WBC PRESENT,BOTH PMN AND MONONUCLEAR CYTOSPIN SMEAR   Culture   Final    NO GROWTH Performed at [facility], [place] [address], [place], [place] [address]    Report Status 10/03/2021 FINAL  Final     Labs: BNP (last 3 results) No results for input(s): BNP in the last 8760 hours. Basic Metabolic Panel: Recent Labs  Lab 10/03/21 0508 10/04/21 0426 10/05/21 0442 10/06/21 0322 10/07/21 0521  NA 143 142 142 140 139  K 2.9* 3.0* 3.4* 3.4* 4.3  CL 111 107 109 110 109  CO2 '23 24 25 26 25  '$ GLUCOSE 163* 151* 129* 152* 131*  BUN '8 9 14 15 11  '$ CREATININE 0.56* 0.51* 0.70 0.59* 0.57*  CALCIUM 8.2*  8.2* 8.2* 8.1* 8.2*  MG 1.5* 1.6* 2.1 1.8 1.9   Liver Function Tests: Recent Labs  Lab 10/01/21 0422  AST 31  ALT 24  ALKPHOS 54  BILITOT 0.9  PROT 5.6*  ALBUMIN 2.8*   No results for input(s): LIPASE, AMYLASE in the last 168 hours. Recent Labs  Lab 10/04/21 0731  AMMONIA 14   CBC: Recent Labs  Lab 10/03/21 0508 10/04/21 0426 10/05/21 0442 10/06/21 0322 10/07/21 0521  WBC 8.5 9.9 8.5 6.6 6.6  HGB 13.2 13.3 13.3 13.2 13.5  HCT 40.2 40.5 39.4 39.0 42.6  MCV 95.5 97.1 98.5 98.5 100.2*  PLT 128* 157 162 190 193   Cardiac Enzymes: No results for input(s): CKTOTAL, CKMB, CKMBINDEX, TROPONINI in the last 168 hours. BNP: Invalid input(s): POCBNP CBG: Recent Labs   Lab 10/06/21 1123 10/06/21 1621 10/06/21 2141 10/07/21 0732 10/07/21 1102  GLUCAP 208* 150* 131* 143* 250*   D-Dimer No results for input(s): DDIMER in the last 72 hours. Hgb A1c No results for input(s): HGBA1C in the last 72 hours. Lipid Profile No results for input(s): CHOL, HDL, LDLCALC, TRIG, CHOLHDL, LDLDIRECT in the last 72 hours. Thyroid function studies No results for input(s): TSH, T4TOTAL, T3FREE, THYROIDAB in the last 72 hours.  Invalid input(s): FREET3 Anemia work up No results for input(s): VITAMINB12, FOLATE, FERRITIN, TIBC, IRON, RETICCTPCT in the last 72 hours. Urinalysis    Component Value Date/Time   COLORURINE YELLOW 09/28/2021 1449   APPEARANCEUR CLEAR 09/28/2021 1449   LABSPEC 1.029 09/28/2021 1449   PHURINE 6.0 09/28/2021 1449   GLUCOSEU >=500 (A) 09/28/2021 1449   HGBUR LARGE (A) 09/28/2021 1449   BILIRUBINUR NEGATIVE 09/28/2021 1449   KETONESUR 20 (A) 09/28/2021 1449   PROTEINUR 100 (A) 09/28/2021 1449   NITRITE NEGATIVE 09/28/2021 1449   LEUKOCYTESUR NEGATIVE 09/28/2021 1449   Sepsis Labs Invalid input(s): PROCALCITONIN,  WBC,  LACTICIDVEN Microbiology Recent Results (from the past 240 hour(s))  Resp Panel by RT-PCR (Flu A&B, Covid) Nasopharyngeal Swab     Status: None   Collection Time: 09/28/21  3:02 PM   Specimen: Nasopharyngeal Swab; Nasopharyngeal(NP) swabs in vial transport medium  Result Value Ref Range Status   SARS Coronavirus 2 by RT PCR NEGATIVE NEGATIVE Final    Comment: (NOTE) SARS-CoV-2 target nucleic acids are NOT DETECTED.  The SARS-CoV-2 RNA is generally detectable in upper respiratory specimens during the acute phase of infection. The lowest concentration of SARS-CoV-2 viral copies this assay can detect is 138 copies/mL. A negative result does not preclude SARS-Cov-2 infection and should not be used as the sole basis for treatment or other patient management decisions. A negative result may occur with  improper specimen  collection/handling, submission of specimen other than nasopharyngeal swab, presence of viral mutation(s) within the areas targeted by this assay, and inadequate number of viral copies(<138 copies/mL). A negative result must be combined with clinical observations, patient history, and epidemiological information. The expected result is Negative.  Fact Sheet for Patients:  EntrepreneurPulse.com.au  Fact Sheet for Healthcare Providers:  IncredibleEmployment.be  This test is no t yet approved or cleared by the [place] FDA and  has been authorized for detection and/or diagnosis of SARS-CoV-2 by FDA under an Emergency Use Authorization (EUA). This EUA will remain  in effect (meaning this test can be used) for the duration of the COVID-19 declaration under Section 564(b)(1) of the Act, 21 U.S.C.section 360bbb-3(b)(1), unless the authorization is terminated  or revoked sooner.  Influenza A by PCR NEGATIVE NEGATIVE Final   Influenza B by PCR NEGATIVE NEGATIVE Final    Comment: (NOTE) The Xpert Xpress SARS-CoV-2/FLU/RSV plus assay is intended as an aid in the diagnosis of influenza from Nasopharyngeal swab specimens and should not be used as a sole basis for treatment. Nasal washings and aspirates are unacceptable for Xpert Xpress SARS-CoV-2/FLU/RSV testing.  Fact Sheet for Patients: EntrepreneurPulse.com.au  Fact Sheet for Healthcare Providers: IncredibleEmployment.be  This test is not yet approved or cleared by the [place] FDA and has been authorized for detection and/or diagnosis of SARS-CoV-2 by FDA under an Emergency Use Authorization (EUA). This EUA will remain in effect (meaning this test can be used) for the duration of the COVID-19 declaration under Section 564(b)(1) of the Act, 21 U.S.C. section 360bbb-3(b)(1), unless the authorization is terminated or revoked.  Performed at [facility], [address], [place], [place] [address]   Urine Culture     Status: Abnormal   Collection Time: 09/28/21  3:02 PM   Specimen: In/Out Cath Urine  Result Value Ref Range Status   Specimen Description   Final    IN/OUT CATH URINE Performed at [facility], [address] [place], [place] [address]    Special Requests   Final    NONE Performed at [facility], [address], [place], [place] [address]    Culture MULTIPLE SPECIES PRESENT, SUGGEST RECOLLECTION (A)  Final   Report Status 09/30/2021 FINAL  Final  Culture, blood (Routine X 2) w Reflex to ID Panel     Status: None   Collection Time: 09/28/21  3:18 PM   Specimen: BLOOD  Result Value Ref Range Status   Specimen Description BLOOD BLOOD LEFT FOREARM  Final   Special Requests   Final    BOTTLES DRAWN AEROBIC AND ANAEROBIC Blood Culture adequate volume   Culture   Final    NO GROWTH 5 DAYS Performed at [facility], [address] [place], [place] [address]    Report Status 10/03/2021 FINAL  Final  Culture, blood (Routine X 2) w Reflex to ID Panel     Status: None   Collection Time: 09/28/21  3:18 PM   Specimen: BLOOD  Result Value Ref Range Status   Specimen Description BLOOD RIGHT ANTECUBITAL  Final   Special Requests   Final    BOTTLES DRAWN AEROBIC AND ANAEROBIC Blood Culture adequate volume   Culture   Final    NO GROWTH 5 DAYS Performed at [facility], [address] [place], [place] [address]    Report Status 10/03/2021 FINAL  Final  MRSA Next Gen by PCR, Nasal     Status: None   Collection Time: 09/29/21 12:00 AM   Specimen: Nasal Mucosa; Nasal Swab  Result Value Ref Range Status   MRSA by PCR Next Gen NOT DETECTED NOT DETECTED Final    Comment: (NOTE) The GeneXpert MRSA Assay (FDA approved for NASAL specimens only), is one component of a comprehensive MRSA colonization surveillance program. It is not intended to diagnose MRSA infection nor to guide or monitor treatment for MRSA infections. Test  performance is not FDA approved in patients less than 76 years old. Performed at [facility], [address], [place],  [address]   CSF culture w Gram Stain     Status: None   Collection Time: 09/30/21  9:36 AM   Specimen: CSF  Result Value Ref Range Status   Specimen Description   Final  CSF Performed at [facility], [address] [place], [place] [address]    Special Requests   Final    NONE Performed at [facility], [address], [place], [place] [address]    Gram Stain   Final    NO ORGANISMS SEEN WBC PRESENT,BOTH PMN AND MONONUCLEAR CYTOSPIN SMEAR   Culture   Final    NO GROWTH Performed at [facility], [place] [address], [place], [place] [address]    Report Status 10/03/2021 FINAL  Final     Time coordinating discharge: 35 minutes  SIGNED:   Rodena Goldmann, DO [facility] 10/07/2021, 11:05 AM  If 7PM-7AM, please contact night-coverage [email or web address]

## 2021-10-07 NOTE — TOC Transition Note (Signed)
Transition of Care [facility]) - CM/SW Discharge Note   Patient Details  Name: Calvin Mills MRN: [id number] Date of Birth: 01-13-1941  [facility] CM/SW Contact:  Salome Arnt, LCSW Phone Number: 10/07/2021, 11:10 AM   Clinical Narrative:  Pt d/c today to [facility]. Pt's son aware and agreeable. He requests transport by [facility]. TOC arranged and [facility] will call RN with arrival time. RN given number to call report. Will send d/c summary when completed. SNF authorization received.      Final next level of care: Skilled Nursing Facility Barriers to Discharge: Barriers Resolved   Patient Goals and CMS Choice Patient states their goals for this hospitalization and ongoing recovery are:: agreeable to SNF CMS Medicare.gov Compare Post Acute Care list provided to:: Patient Represenative (must comment) Choice offered to / list presented to : Adult Children  Discharge Placement              Patient chooses bed at: [facility] Patient to be transferred to facility by: [facility] Name of family member notified: Son Patient and family notified of of transfer: 10/07/21  Discharge Plan and Services                                     Social Determinants of Health ([place]) Interventions     Readmission Risk Interventions    10/03/2021   11:22 AM 10/02/2021    3:18 PM  Readmission Risk Prevention Plan  Post Dischage Appt Complete Not Complete  Medication Screening Complete Complete  Transportation Screening Complete Complete

## 2021-10-07 NOTE — Progress Notes (Signed)
Report called to Callery at [facility].

## 2021-10-07 NOTE — Discharge Instructions (Signed)

## 2021-10-08 ENCOUNTER — Telehealth: Payer: Self-pay

## 2021-10-08 DIAGNOSIS — M6281 Muscle weakness (generalized): Secondary | ICD-10-CM | POA: Diagnosis not present

## 2021-10-08 DIAGNOSIS — I4891 Unspecified atrial fibrillation: Secondary | ICD-10-CM | POA: Diagnosis not present

## 2021-10-08 DIAGNOSIS — A419 Sepsis, unspecified organism: Secondary | ICD-10-CM | POA: Diagnosis not present

## 2021-10-08 DIAGNOSIS — I2699 Other pulmonary embolism without acute cor pulmonale: Secondary | ICD-10-CM | POA: Diagnosis not present

## 2021-10-08 NOTE — Telephone Encounter (Signed)
Received call from Johny Blamer at [facility] wanting to schedule ID follow up for patient.   Per Dr. Juleen China, no need for ID follow up. Called Elmyra Ricks back to let her know, no answer. Left voicemail stating patient does not need to follow up with our office.  Elmyra Ricks: [phone]  Beryle Flock, RN

## 2021-10-09 DIAGNOSIS — I2699 Other pulmonary embolism without acute cor pulmonale: Secondary | ICD-10-CM | POA: Diagnosis not present

## 2021-10-09 DIAGNOSIS — M6281 Muscle weakness (generalized): Secondary | ICD-10-CM | POA: Diagnosis not present

## 2021-10-09 DIAGNOSIS — I4891 Unspecified atrial fibrillation: Secondary | ICD-10-CM | POA: Diagnosis not present

## 2021-10-09 DIAGNOSIS — I10 Essential (primary) hypertension: Secondary | ICD-10-CM | POA: Diagnosis not present

## 2021-10-14 DIAGNOSIS — G4700 Insomnia, unspecified: Secondary | ICD-10-CM | POA: Diagnosis not present

## 2021-10-14 DIAGNOSIS — I10 Essential (primary) hypertension: Secondary | ICD-10-CM | POA: Diagnosis not present

## 2021-10-14 DIAGNOSIS — I4891 Unspecified atrial fibrillation: Secondary | ICD-10-CM | POA: Diagnosis not present

## 2021-10-14 DIAGNOSIS — I2699 Other pulmonary embolism without acute cor pulmonale: Secondary | ICD-10-CM | POA: Diagnosis not present

## 2021-10-15 DIAGNOSIS — I4891 Unspecified atrial fibrillation: Secondary | ICD-10-CM | POA: Diagnosis not present

## 2021-10-15 DIAGNOSIS — I10 Essential (primary) hypertension: Secondary | ICD-10-CM | POA: Diagnosis not present

## 2021-10-15 DIAGNOSIS — I2699 Other pulmonary embolism without acute cor pulmonale: Secondary | ICD-10-CM | POA: Diagnosis not present

## 2021-10-17 DIAGNOSIS — I10 Essential (primary) hypertension: Secondary | ICD-10-CM | POA: Diagnosis not present

## 2021-10-17 DIAGNOSIS — I2699 Other pulmonary embolism without acute cor pulmonale: Secondary | ICD-10-CM | POA: Diagnosis not present

## 2021-10-17 DIAGNOSIS — I4891 Unspecified atrial fibrillation: Secondary | ICD-10-CM | POA: Diagnosis not present

## 2021-10-17 DIAGNOSIS — M6281 Muscle weakness (generalized): Secondary | ICD-10-CM | POA: Diagnosis not present

## 2021-11-27 DIAGNOSIS — N390 Urinary tract infection, site not specified: Secondary | ICD-10-CM | POA: Diagnosis not present

## 2021-12-23 DIAGNOSIS — R82998 Other abnormal findings in urine: Secondary | ICD-10-CM | POA: Diagnosis not present

## 2022-02-06 DIAGNOSIS — N390 Urinary tract infection, site not specified: Secondary | ICD-10-CM | POA: Diagnosis not present

## 2022-04-22 DIAGNOSIS — I472 Ventricular tachycardia, unspecified: Secondary | ICD-10-CM | POA: Diagnosis not present

## 2022-04-22 DIAGNOSIS — J9601 Acute respiratory failure with hypoxia: Secondary | ICD-10-CM | POA: Diagnosis not present

## 2022-04-22 DIAGNOSIS — I160 Hypertensive urgency: Secondary | ICD-10-CM | POA: Diagnosis not present

## 2022-04-22 DIAGNOSIS — R0602 Shortness of breath: Secondary | ICD-10-CM | POA: Diagnosis not present

## 2022-04-22 DIAGNOSIS — I480 Paroxysmal atrial fibrillation: Secondary | ICD-10-CM | POA: Diagnosis not present

## 2022-04-22 DIAGNOSIS — R0902 Hypoxemia: Secondary | ICD-10-CM | POA: Diagnosis not present

## 2022-04-22 DIAGNOSIS — Z7984 Long term (current) use of oral hypoglycemic drugs: Secondary | ICD-10-CM | POA: Diagnosis not present

## 2022-04-22 DIAGNOSIS — B974 Respiratory syncytial virus as the cause of diseases classified elsewhere: Secondary | ICD-10-CM | POA: Diagnosis not present

## 2022-04-22 DIAGNOSIS — A419 Sepsis, unspecified organism: Secondary | ICD-10-CM | POA: Diagnosis not present

## 2022-04-22 DIAGNOSIS — F0390 Unspecified dementia without behavioral disturbance: Secondary | ICD-10-CM | POA: Diagnosis not present

## 2022-04-22 DIAGNOSIS — Z66 Do not resuscitate: Secondary | ICD-10-CM | POA: Diagnosis not present

## 2022-04-22 DIAGNOSIS — I4891 Unspecified atrial fibrillation: Secondary | ICD-10-CM | POA: Diagnosis not present

## 2022-04-22 DIAGNOSIS — Z79899 Other long term (current) drug therapy: Secondary | ICD-10-CM | POA: Diagnosis not present

## 2022-04-22 DIAGNOSIS — G4700 Insomnia, unspecified: Secondary | ICD-10-CM | POA: Diagnosis not present

## 2022-04-22 DIAGNOSIS — G9341 Metabolic encephalopathy: Secondary | ICD-10-CM | POA: Diagnosis not present

## 2022-04-22 DIAGNOSIS — R6520 Severe sepsis without septic shock: Secondary | ICD-10-CM | POA: Diagnosis not present

## 2022-04-22 DIAGNOSIS — Z86711 Personal history of pulmonary embolism: Secondary | ICD-10-CM | POA: Diagnosis not present

## 2022-04-22 DIAGNOSIS — R000 Tachycardia, unspecified: Secondary | ICD-10-CM | POA: Diagnosis not present

## 2022-04-22 DIAGNOSIS — E1165 Type 2 diabetes mellitus with hyperglycemia: Secondary | ICD-10-CM | POA: Diagnosis not present

## 2022-04-22 DIAGNOSIS — Z20822 Contact with and (suspected) exposure to covid-19: Secondary | ICD-10-CM | POA: Diagnosis not present

## 2022-04-22 DIAGNOSIS — I10 Essential (primary) hypertension: Secondary | ICD-10-CM | POA: Diagnosis not present

## 2022-04-22 DIAGNOSIS — J189 Pneumonia, unspecified organism: Secondary | ICD-10-CM | POA: Diagnosis not present

## 2022-04-22 DIAGNOSIS — K5909 Other constipation: Secondary | ICD-10-CM | POA: Diagnosis not present

## 2022-04-22 DIAGNOSIS — Z515 Encounter for palliative care: Secondary | ICD-10-CM | POA: Diagnosis not present

## 2022-04-22 DIAGNOSIS — Z7901 Long term (current) use of anticoagulants: Secondary | ICD-10-CM | POA: Diagnosis not present

## 2022-04-23 DIAGNOSIS — R109 Unspecified abdominal pain: Secondary | ICD-10-CM | POA: Diagnosis not present

## 2022-04-23 DIAGNOSIS — A419 Sepsis, unspecified organism: Secondary | ICD-10-CM | POA: Diagnosis not present

## 2022-07-26 DIAGNOSIS — L603 Nail dystrophy: Secondary | ICD-10-CM | POA: Diagnosis not present

## 2022-07-26 DIAGNOSIS — B351 Tinea unguium: Secondary | ICD-10-CM | POA: Diagnosis not present

## 2022-07-26 DIAGNOSIS — R2689 Other abnormalities of gait and mobility: Secondary | ICD-10-CM | POA: Diagnosis not present

## 2022-07-26 DIAGNOSIS — R2681 Unsteadiness on feet: Secondary | ICD-10-CM | POA: Diagnosis not present

## 2022-07-26 DIAGNOSIS — E119 Type 2 diabetes mellitus without complications: Secondary | ICD-10-CM | POA: Diagnosis not present

## 2022-11-24 DIAGNOSIS — N390 Urinary tract infection, site not specified: Secondary | ICD-10-CM | POA: Diagnosis not present

## 2022-11-26 DIAGNOSIS — R42 Dizziness and giddiness: Secondary | ICD-10-CM | POA: Diagnosis not present

## 2022-11-26 DIAGNOSIS — Z743 Need for continuous supervision: Secondary | ICD-10-CM | POA: Diagnosis not present

## 2022-11-26 DIAGNOSIS — R519 Headache, unspecified: Secondary | ICD-10-CM | POA: Diagnosis not present

## 2022-11-26 DIAGNOSIS — F0390 Unspecified dementia without behavioral disturbance: Secondary | ICD-10-CM | POA: Diagnosis not present

## 2022-11-26 DIAGNOSIS — W19XXXA Unspecified fall, initial encounter: Secondary | ICD-10-CM | POA: Diagnosis not present

## 2022-11-26 DIAGNOSIS — E119 Type 2 diabetes mellitus without complications: Secondary | ICD-10-CM | POA: Diagnosis not present

## 2022-11-26 DIAGNOSIS — Z86711 Personal history of pulmonary embolism: Secondary | ICD-10-CM | POA: Diagnosis not present

## 2022-11-26 DIAGNOSIS — Z9181 History of falling: Secondary | ICD-10-CM | POA: Diagnosis not present

## 2022-11-26 DIAGNOSIS — I10 Essential (primary) hypertension: Secondary | ICD-10-CM | POA: Diagnosis not present

## 2022-11-26 DIAGNOSIS — Z7901 Long term (current) use of anticoagulants: Secondary | ICD-10-CM | POA: Diagnosis not present

## 2022-11-26 DIAGNOSIS — E785 Hyperlipidemia, unspecified: Secondary | ICD-10-CM | POA: Diagnosis not present

## 2022-11-30 DIAGNOSIS — W1830XA Fall on same level, unspecified, initial encounter: Secondary | ICD-10-CM | POA: Diagnosis not present

## 2022-11-30 DIAGNOSIS — Z86711 Personal history of pulmonary embolism: Secondary | ICD-10-CM | POA: Diagnosis not present

## 2022-11-30 DIAGNOSIS — E119 Type 2 diabetes mellitus without complications: Secondary | ICD-10-CM | POA: Diagnosis not present

## 2022-11-30 DIAGNOSIS — Z7401 Bed confinement status: Secondary | ICD-10-CM | POA: Diagnosis not present

## 2022-11-30 DIAGNOSIS — R29818 Other symptoms and signs involving the nervous system: Secondary | ICD-10-CM | POA: Diagnosis not present

## 2022-11-30 DIAGNOSIS — S59919A Unspecified injury of unspecified forearm, initial encounter: Secondary | ICD-10-CM | POA: Diagnosis not present

## 2022-11-30 DIAGNOSIS — M79604 Pain in right leg: Secondary | ICD-10-CM | POA: Diagnosis not present

## 2022-11-30 DIAGNOSIS — A419 Sepsis, unspecified organism: Secondary | ICD-10-CM | POA: Diagnosis not present

## 2022-11-30 DIAGNOSIS — D649 Anemia, unspecified: Secondary | ICD-10-CM | POA: Diagnosis not present

## 2022-11-30 DIAGNOSIS — Z743 Need for continuous supervision: Secondary | ICD-10-CM | POA: Diagnosis not present

## 2022-11-30 DIAGNOSIS — M79601 Pain in right arm: Secondary | ICD-10-CM | POA: Diagnosis not present

## 2022-11-30 DIAGNOSIS — F0390 Unspecified dementia without behavioral disturbance: Secondary | ICD-10-CM | POA: Diagnosis not present

## 2022-11-30 DIAGNOSIS — R296 Repeated falls: Secondary | ICD-10-CM | POA: Diagnosis not present

## 2022-11-30 DIAGNOSIS — R531 Weakness: Secondary | ICD-10-CM | POA: Diagnosis not present

## 2022-11-30 DIAGNOSIS — S32415A Nondisplaced fracture of anterior wall of left acetabulum, initial encounter for closed fracture: Secondary | ICD-10-CM | POA: Diagnosis not present

## 2022-11-30 DIAGNOSIS — W19XXXA Unspecified fall, initial encounter: Secondary | ICD-10-CM | POA: Diagnosis not present

## 2022-11-30 DIAGNOSIS — I10 Essential (primary) hypertension: Secondary | ICD-10-CM | POA: Diagnosis not present

## 2023-06-19 DEATH — deceased

## 2024-03-27 IMAGING — RF DG SPINAL PUNCT LUMBAR DIAG WITH FL CT GUIDANCE
1 series · 1 of 1 positions shown · non-contrast
Comparison: CT head 09/28/2021

CLINICAL DATA: Fever of unknown origin, altered mental status

EXAM:
DIAGNOSTIC LUMBAR PUNCTURE UNDER FLUOROSCOPIC GUIDANCE

[Series 1: cp_standard · 0.17mm/px · 1 of 1 slices shown]
[im 1/1]
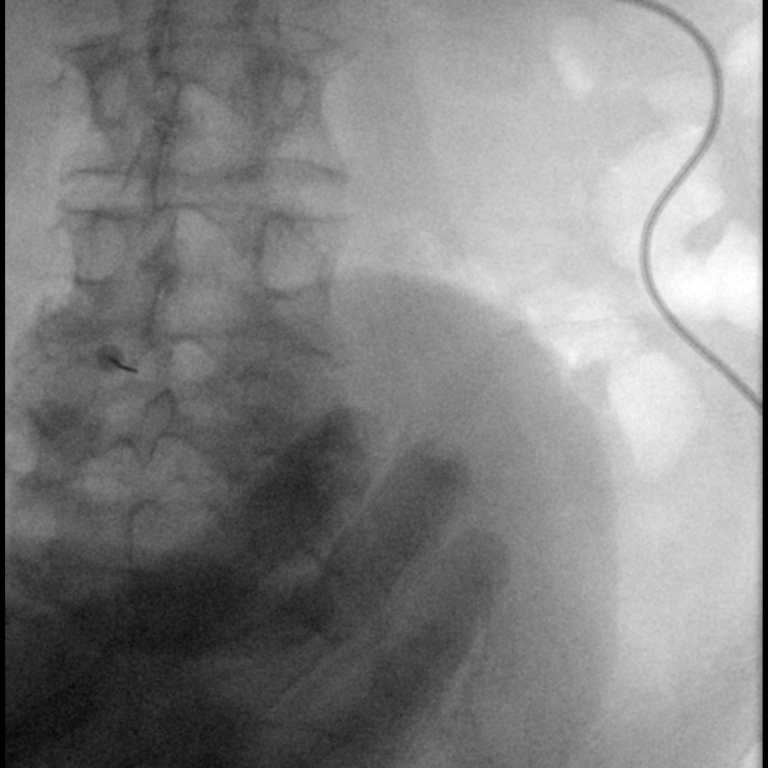

[1 of 1 positions shown; findings below may reference images not displayed]

FLUOROSCOPY:
Radiation Exposure Index (as provided by the fluoroscopic device):
16.4 mGy Kerma

PROCEDURE:
Procedure, benefits, and risks were discussed with the patient's
son,.

Patient's questions were answered.

Written informed consent was obtained.

Timeout protocol followed.

Patient placed prone.

L4-L5 disc space was localized under fluoroscopy.

Skin prepped and draped in usual sterile fashion.

Skin and soft tissues anesthetized with 5 mL of 1% lidocaine.

22 gauge spinal needle was advanced into the spinal canal where
clear colorless CSF was encountered with an opening pressure of 9 cm
H2O (measured prone).

11 mL of CSF was obtained in 4 tubes for requested analysis.

Procedure tolerated very well by patient without immediate
complication.
IMPRESSION: Successful fluoroscopic guided lumbar puncture as above.
